# Patient Record
Sex: Female | Born: 1985 | Race: Black or African American | Hispanic: No | Marital: Single | Smoking: Never smoker
Health system: Southern US, Community
[De-identification: ages and names within clinical notes are randomized; demographics above are authoritative.]

## PROBLEM LIST (undated history)

## (undated) ENCOUNTER — Inpatient Hospital Stay (HOSPITAL_COMMUNITY): Payer: Self-pay

## (undated) DIAGNOSIS — E039 Hypothyroidism, unspecified: Secondary | ICD-10-CM

## (undated) DIAGNOSIS — I10 Essential (primary) hypertension: Secondary | ICD-10-CM

## (undated) DIAGNOSIS — E119 Type 2 diabetes mellitus without complications: Secondary | ICD-10-CM

## (undated) HISTORY — DX: Hypothyroidism, unspecified: E03.9

## (undated) HISTORY — PX: NO PAST SURGERIES: SHX2092

---

## 2015-02-18 ENCOUNTER — Inpatient Hospital Stay (HOSPITAL_COMMUNITY): Payer: Medicaid Other

## 2015-02-18 ENCOUNTER — Inpatient Hospital Stay (HOSPITAL_COMMUNITY)
Admission: AD | Admit: 2015-02-18 | Discharge: 2015-02-18 | Disposition: A | Discharge status: Home / Self Care | Payer: Medicaid Other | Source: Ambulatory Visit | Attending: Obstetrics & Gynecology | Admitting: Obstetrics & Gynecology

## 2015-02-18 ENCOUNTER — Encounter (HOSPITAL_COMMUNITY): Payer: Self-pay

## 2015-02-18 DIAGNOSIS — E119 Type 2 diabetes mellitus without complications: Secondary | ICD-10-CM | POA: Insufficient documentation

## 2015-02-18 DIAGNOSIS — O209 Hemorrhage in early pregnancy, unspecified: Secondary | ICD-10-CM

## 2015-02-18 DIAGNOSIS — O208 Other hemorrhage in early pregnancy: Secondary | ICD-10-CM | POA: Insufficient documentation

## 2015-02-18 DIAGNOSIS — O24111 Pre-existing diabetes mellitus, type 2, in pregnancy, first trimester: Secondary | ICD-10-CM | POA: Insufficient documentation

## 2015-02-18 DIAGNOSIS — O10911 Unspecified pre-existing hypertension complicating pregnancy, first trimester: Secondary | ICD-10-CM | POA: Insufficient documentation

## 2015-02-18 DIAGNOSIS — Z3A08 8 weeks gestation of pregnancy: Secondary | ICD-10-CM | POA: Insufficient documentation

## 2015-02-18 DIAGNOSIS — N939 Abnormal uterine and vaginal bleeding, unspecified: Secondary | ICD-10-CM | POA: Diagnosis present

## 2015-02-18 DIAGNOSIS — O4691 Antepartum hemorrhage, unspecified, first trimester: Secondary | ICD-10-CM

## 2015-02-18 HISTORY — DX: Type 2 diabetes mellitus without complications: E11.9

## 2015-02-18 HISTORY — DX: Essential (primary) hypertension: I10

## 2015-02-18 LAB — URINALYSIS, ROUTINE W REFLEX MICROSCOPIC
Glucose, UA: NEGATIVE mg/dL
Ketones, ur: 15 mg/dL — AB
LEUKOCYTES UA: NEGATIVE
NITRITE: NEGATIVE
PH: 5.5 (ref 5.0–8.0)
Protein, ur: 100 mg/dL — AB

## 2015-02-18 LAB — URINE MICROSCOPIC-ADD ON

## 2015-02-18 LAB — CBC
HEMATOCRIT: 35.4 % — AB (ref 36.0–46.0)
Hemoglobin: 12.1 g/dL (ref 12.0–15.0)
MCH: 25.6 pg — ABNORMAL LOW (ref 26.0–34.0)
MCHC: 34.2 g/dL (ref 30.0–36.0)
MCV: 75 fL — AB (ref 78.0–100.0)
Platelets: 443 10*3/uL — ABNORMAL HIGH (ref 150–400)
RBC: 4.72 MIL/uL (ref 3.87–5.11)
RDW: 13.9 % (ref 11.5–15.5)
WBC: 13.5 10*3/uL — AB (ref 4.0–10.5)

## 2015-02-18 LAB — WET PREP, GENITAL
Sperm: NONE SEEN
TRICH WET PREP: NONE SEEN
Yeast Wet Prep HPF POC: NONE SEEN

## 2015-02-18 LAB — HCG, QUANTITATIVE, PREGNANCY: hCG, Beta Chain, Quant, S: 10628 m[IU]/mL — ABNORMAL HIGH (ref <5)

## 2015-02-18 LAB — ABO/RH: ABO/RH(D): B POS

## 2015-02-18 LAB — POCT PREGNANCY, URINE: PREG TEST UR: POSITIVE — AB

## 2015-02-18 NOTE — MAU Note (Addendum)
Had positive UPT at ED at [facility], LMP 12/21/14, started having vaginal bleeding which started about 45 minutes ago light in nature has not changed a pad, no recent intercourse, no pain. Has history of hight blood pressure just ran out of her med.

## 2015-02-18 NOTE — Discharge Instructions (Signed)
No smoking, no drugs, no alcohol.  Take a prenatal vitamin one by mouth every day.  Eat small frequent snacks to avoid nausea.  Begin prenatal care as soon as possible. Expect the clinic to call you about an appointment downstairs at the [facility].

## 2015-02-18 NOTE — MAU Provider Note (Signed)
History     CSN: [id number]  Arrival date and time: 02/18/15 1415   First Provider Initiated Contact with Patient 02/18/15 1550      Chief Complaint  Patient presents with  . Vaginal Bleeding   HPI Jillian Hanson 30 y.o. [redacted]w[redacted]d  Client had acute onset of bright red bleeding while at work.  Has known she was pregnant for a couple of weeks.  Was surprised by the bleeding.  Has not had intercourse today.  Is not having cramping at this time.  Has not yet started prenatal care.  Is wondering if this bleeding is a miscarriage.  OB History    Gravida Para Term Preterm AB TAB SAB Ectopic Multiple Living   1               Past Medical History  Diagnosis Date  . Hypertension   . Diabetes mellitus without complication (HCC)     History reviewed. No pertinent past surgical history.  History reviewed. No pertinent family history.  Social History  Substance Use Topics  . Smoking status: Never Smoker   . Smokeless tobacco: Never Used  . Alcohol Use: No    Allergies:  Allergies  Allergen Reactions  . Peanut-Containing Drug Products Anaphylaxis    Prescriptions prior to admission  Medication Sig Dispense Refill Last Dose  . Cephalexin (KEFLEX PO) Take 1 capsule by mouth 4 (four) times daily - after meals and at bedtime. Unknown strength, pharmacy closed at time to verify   02/17/2015 at Unknown time  . levothyroxine (SYNTHROID, LEVOTHROID) 100 MCG tablet Take 100 mcg by mouth daily before breakfast.   Past Month at Unknown time  . LISINOPRIL PO Take 1 tablet by mouth daily. Reported on 02/18/2015   12/19/2014  . METFORMIN HCL PO Take 1 tablet by mouth daily. Unknown strength/form, pharmacy closed at time to verify   12/19/2014  . Prenatal Vit-Fe Fumarate-FA (PRENATAL MULTIVITAMIN) TABS tablet Take 1 tablet by mouth daily at 12 noon.   02/18/2015 at Unknown time  . triamcinolone cream (KENALOG) 0.1 % Apply 1 application topically daily.   02/18/2015 at Unknown time    Review of Systems   Constitutional: Negative for fever.  Gastrointestinal: Negative for nausea, vomiting and abdominal pain.  Genitourinary:       No vaginal discharge. Vaginal bleeding. No dysuria.    Physical Exam   Blood pressure 162/105, pulse 112, temperature 98.6 F (37 C), temperature source Oral, resp. rate 18, height 5' 2.99" (1.6 m), weight 249 lb 3.2 oz (113.036 kg), last menstrual period 12/21/2014, SpO2 100 %.  Physical Exam  Nursing note and vitals reviewed. Constitutional: She is oriented to person, place, and time. She appears well-developed and well-nourished.  obese  HENT:  Head: Normocephalic.  Eyes: EOM are normal.  Neck: Neck supple.  GI: Soft. There is no tenderness.  Genitourinary:  Speculum exam: Vagina - Small amount of red blood noted, no odor Cervix - No active bleeding Bimanual exam: Cervix closed Uterus non tender, exam limited by habitus Adnexa non tender, exam limited by habitus GC/Chlam, wet prep done Chaperone present for exam.  Musculoskeletal: Normal range of motion.  Neurological: She is alert and oriented to person, place, and time.  Skin: Skin is warm and dry.  Psychiatric: She has a normal mood and affect.    MAU Course  Procedures Results for orders placed or performed during the hospital encounter of 02/18/15 (from the past 24 hour(s))  Urinalysis, Routine w reflex microscopic (not  at [facility])     Status: Abnormal   Collection Time: 02/18/15  2:30 PM  Result Value Ref Range   Color, Urine AMBER (A) YELLOW   APPearance CLEAR CLEAR   Specific Gravity, Urine >1.030 (H) 1.005 - 1.030   pH 5.5 5.0 - 8.0   Glucose, UA NEGATIVE NEGATIVE mg/dL   Hgb urine dipstick LARGE (A) NEGATIVE   Bilirubin Urine SMALL (A) NEGATIVE   Ketones, ur 15 (A) NEGATIVE mg/dL   Protein, ur 914 (A) NEGATIVE mg/dL   Nitrite NEGATIVE NEGATIVE   Leukocytes, UA NEGATIVE NEGATIVE  Urine microscopic-add on     Status: Abnormal   Collection Time: 02/18/15  2:30 PM  Result Value  Ref Range   Squamous Epithelial / LPF 6-30 (A) NONE SEEN   WBC, UA 0-5 0 - 5 WBC/hpf   RBC / HPF 6-30 0 - 5 RBC/hpf   Bacteria, UA FEW (A) NONE SEEN   Urine-Other MUCOUS PRESENT   Pregnancy, urine POC     Status: Abnormal   Collection Time: 02/18/15  2:38 PM  Result Value Ref Range   Preg Test, Ur POSITIVE (A) NEGATIVE  CBC     Status: Abnormal   Collection Time: 02/18/15  2:50 PM  Result Value Ref Range   WBC 13.5 (H) 4.0 - 10.5 K/uL   RBC 4.72 3.87 - 5.11 MIL/uL   Hemoglobin 12.1 12.0 - 15.0 g/dL   HCT 78.2 (L) 95.6 - 21.3 %   MCV 75.0 (L) 78.0 - 100.0 fL   MCH 25.6 (L) 26.0 - 34.0 pg   MCHC 34.2 30.0 - 36.0 g/dL   RDW 08.6 57.8 - 46.9 %   Platelets 443 (H) 150 - 400 K/uL  hCG, quantitative, pregnancy     Status: Abnormal   Collection Time: 02/18/15  2:50 PM  Result Value Ref Range   hCG, Beta Chain, Quant, S 10628 (H) <5 mIU/mL  ABO/Rh     Status: None (Preliminary result)   Collection Time: 02/18/15  2:50 PM  Result Value Ref Range   ABO/RH(D) B POS   Wet prep, genital     Status: Abnormal   Collection Time: 02/18/15  4:56 PM  Result Value Ref Range   Yeast Wet Prep HPF POC NONE SEEN NONE SEEN   Trich, Wet Prep NONE SEEN NONE SEEN   Clue Cells Wet Prep HPF POC PRESENT (A) NONE SEEN   WBC, Wet Prep HPF POC FEW (A) NONE SEEN   Sperm NONE SEEN    CLINICAL DATA: First trimester pregnancy, vaginal bleeding  EXAM: OBSTETRIC <14 WK [place] AND TRANSVAGINAL OB US  TECHNIQUE: Both transabdominal and transvaginal ultrasound examinations were performed for complete evaluation of the gestation as well as the maternal uterus, adnexal regions, and pelvic cul-de-sac. Transvaginal technique was performed to assess early pregnancy.  COMPARISON: None.  FINDINGS: Intrauterine gestational sac: Visualized/normal in shape.  Yolk sac: Visualized  Embryo: Visualized  Cardiac Activity: Visualized  Heart Rate: 144 bpm  CRL: 6 mm 6 w 3 d [place] EDC:  10/11/2015  Subchorionic hemorrhage: None visualized.  Maternal uterus/adnexae: Right ovary not visualized. Left ovary normal. No free fluid.  IMPRESSION: Single live intrauterine gestation with normal appearance   MDM Repeat blood pressure after pelvic exam -136/72.  Has recently stopped taking her blood pressure medications - ran out.  Assessment and Plan  Living IUP with vaginal bleeding Hx of Diabetes Type 2 - currently on medication Hx of HTN - currently not on medication  Plan No smoking, no drugs, no alcohol.  Take a prenatal vitamin one by mouth every day.  Eat small frequent snacks to avoid nausea.  Begin prenatal care as soon as possible.  Message sent to [facility] clinic to see if they can arrange an appointment to begin prenatal care.  If not, client can be [facility] at the [facility] and then referred to the [facility].  Client reports her blood sugars have been about 165.  Recommend she begin prenatal care ASAP. Continue metformin. Do not restart BP or Thyroid meds until you are reevaluated.  Did not start BP meds today as repeat BP was normal.   BURLESON,TERRI 02/18/2015, 4:57 PM

## 2015-02-19 LAB — RPR: RPR Ser Ql: NONREACTIVE

## 2015-02-19 LAB — HIV ANTIBODY (ROUTINE TESTING W REFLEX): HIV Screen 4th Generation wRfx: NONREACTIVE

## 2015-02-20 LAB — GC/CHLAMYDIA PROBE AMP (~~LOC~~) NOT AT ARMC
CHLAMYDIA, DNA PROBE: NEGATIVE
Neisseria Gonorrhea: NEGATIVE

## 2015-02-26 ENCOUNTER — Other Ambulatory Visit: Payer: Self-pay

## 2015-02-26 ENCOUNTER — Inpatient Hospital Stay (HOSPITAL_COMMUNITY)
Admission: AD | Admit: 2015-02-26 | Discharge: 2015-02-26 | Disposition: A | Discharge status: Home / Self Care | Payer: Medicaid Other | Source: Ambulatory Visit | Attending: Obstetrics & Gynecology | Admitting: Obstetrics & Gynecology

## 2015-02-26 ENCOUNTER — Encounter (HOSPITAL_COMMUNITY): Payer: Self-pay | Admitting: *Deleted

## 2015-02-26 DIAGNOSIS — R000 Tachycardia, unspecified: Secondary | ICD-10-CM | POA: Diagnosis present

## 2015-02-26 DIAGNOSIS — O26891 Other specified pregnancy related conditions, first trimester: Secondary | ICD-10-CM | POA: Insufficient documentation

## 2015-02-26 DIAGNOSIS — O99891 Other specified diseases and conditions complicating pregnancy: Secondary | ICD-10-CM

## 2015-02-26 DIAGNOSIS — O10911 Unspecified pre-existing hypertension complicating pregnancy, first trimester: Secondary | ICD-10-CM | POA: Diagnosis not present

## 2015-02-26 DIAGNOSIS — Z3A01 Less than 8 weeks gestation of pregnancy: Secondary | ICD-10-CM | POA: Insufficient documentation

## 2015-02-26 DIAGNOSIS — O9989 Other specified diseases and conditions complicating pregnancy, childbirth and the puerperium: Secondary | ICD-10-CM

## 2015-02-26 DIAGNOSIS — M549 Dorsalgia, unspecified: Secondary | ICD-10-CM | POA: Diagnosis not present

## 2015-02-26 DIAGNOSIS — O368390 Maternal care for abnormalities of the fetal heart rate or rhythm, unspecified trimester, not applicable or unspecified: Secondary | ICD-10-CM

## 2015-02-26 LAB — COMPREHENSIVE METABOLIC PANEL
ALK PHOS: 62 U/L (ref 38–126)
ALT: 34 U/L (ref 14–54)
AST: 30 U/L (ref 15–41)
Albumin: 3.5 g/dL (ref 3.5–5.0)
Anion gap: 11 (ref 5–15)
BUN: 9 mg/dL (ref 6–20)
CALCIUM: 9.6 mg/dL (ref 8.9–10.3)
CO2: 25 mmol/L (ref 22–32)
CREATININE: 0.55 mg/dL (ref 0.44–1.00)
Chloride: 99 mmol/L — ABNORMAL LOW (ref 101–111)
Glucose, Bld: 205 mg/dL — ABNORMAL HIGH (ref 65–99)
Potassium: 3.8 mmol/L (ref 3.5–5.1)
Sodium: 135 mmol/L (ref 135–145)
Total Bilirubin: 0.5 mg/dL (ref 0.3–1.2)
Total Protein: 8.5 g/dL — ABNORMAL HIGH (ref 6.5–8.1)

## 2015-02-26 LAB — URINALYSIS, ROUTINE W REFLEX MICROSCOPIC
Bilirubin Urine: NEGATIVE
GLUCOSE, UA: NEGATIVE mg/dL
Hgb urine dipstick: NEGATIVE
Ketones, ur: 15 mg/dL — AB
LEUKOCYTES UA: NEGATIVE
Nitrite: NEGATIVE
PH: 6 (ref 5.0–8.0)
Protein, ur: NEGATIVE mg/dL
Specific Gravity, Urine: >1.03 — ABNORMAL HIGH (ref 1.005–1.030)

## 2015-02-26 LAB — CBC
HEMATOCRIT: 37.2 % (ref 36.0–46.0)
HEMOGLOBIN: 12.7 g/dL (ref 12.0–15.0)
MCH: 25.5 pg — AB (ref 26.0–34.0)
MCHC: 34.1 g/dL (ref 30.0–36.0)
MCV: 74.5 fL — AB (ref 78.0–100.0)
PLATELETS: 472 10*3/uL — AB (ref 150–400)
RBC: 4.99 MIL/uL (ref 3.87–5.11)
RDW: 14 % (ref 11.5–15.5)
WBC: 8 10*3/uL (ref 4.0–10.5)

## 2015-02-26 MED ORDER — LABETALOL HCL 100 MG PO TABS: 100.0000 mg | ORAL_TABLET | Freq: Two times a day (BID) | ORAL | Status: DC

## 2015-02-26 MED ORDER — LABETALOL HCL 100 MG PO TABS
100.0000 mg | ORAL_TABLET | Freq: Once | ORAL | Status: AC
  Administered 2015-02-26: 100 mg via ORAL
  Filled 2015-02-26: qty 1

## 2015-02-26 NOTE — MAU Provider Note (Signed)
Chief Complaint: Pain between shoulder blades    First Provider Initiated Contact with Patient 02/26/15 1616     SUBJECTIVE HPI: Jillian Hanson is a 30 y.o. G1P0 at [redacted]w[redacted]d who presents to Maternity Admissions reporting rapid heart rate 2 days in pain between shoulder blades since last night both severe enough to wake her repeatedly from sleep. Partial improvement of pain with massage and warm bath. Has history of chronic hypertension and is worried that these may be symptoms of cardiac arrest.   Has been told that she has elevated heart rate in the past. Had thyroid checked and was started on levothyroxine. Has not taken that for one month because she ran out. Has not had any cardiac workup. Has never been put on any other medicine for rapid heart rate.  Location: Between shoulder blades Quality: Tight, sometimes sharp Severity: 5/10 on pain scale Duration: 48 hours Context: None Course: Unchanged Timing: Constant Modifying factors: Partial improvement with massage and warm bath.  Associated signs and symptoms: Positive for mild shortness of breath when lying down. Negative for chest pain, dizziness, palpitations, syncope or near syncope.   Past Medical History  Diagnosis Date  . Hypertension   . Diabetes mellitus without complication (HCC)    OB History  Gravida Para Term Preterm AB SAB TAB Ectopic Multiple Living  1             # Outcome Date GA Lbr Len/2nd Weight Sex Delivery Anes PTL Lv  1 Current              Past Surgical History  Procedure Laterality Date  . No past surgeries     Social History   Social History  . Marital Status: Single    Spouse Name: N/A  . Number of Children: N/A  . Years of Education: N/A   Occupational History  . Not on file.   Social History Main Topics  . Smoking status: Never Smoker   . Smokeless tobacco: Never Used  . Alcohol Use: No  . Drug Use: No  . Sexual Activity: Yes   Other Topics Concern  . Not on file   Social History  Narrative   No current facility-administered medications on file prior to encounter.   Current Outpatient Prescriptions on File Prior to Encounter  Medication Sig Dispense Refill  . Prenatal Vit-Fe Fumarate-FA (PRENATAL MULTIVITAMIN) TABS tablet Take 1 tablet by mouth daily at 12 noon.     Allergies  Allergen Reactions  . Peanut-Containing Drug Products Anaphylaxis    I have reviewed the past Medical Hx, Surgical Hx, Social Hx, Allergies and Medications.   Review of Systems  Constitutional: Negative for fever, chills and appetite change.  Respiratory: Positive for shortness of breath. Negative for cough, chest tightness and wheezing.   Cardiovascular: Negative for chest pain, palpitations and leg swelling.  Gastrointestinal: Negative for abdominal pain.    OBJECTIVE Patient Vitals for the past 24 hrs:  BP Temp Temp src Pulse Resp SpO2  02/26/15 1716 135/83 mmHg - - 96 - -  02/26/15 1713 - - - 103 - 100 %  02/26/15 1708 - - - 106 - 98 %  02/26/15 1703 - - - 102 - 100 %  02/26/15 1701 140/84 mmHg - - 98 - -  02/26/15 1658 - - - 104 - 100 %  02/26/15 1653 - - - 111 - 99 %  02/26/15 1649 141/91 mmHg - - (!) 127 - -  02/26/15 1648 - - - 116 -  100 %  02/26/15 1647 - - - 110 - 98 %  02/26/15 1645 - - - 99 - 98 %  02/26/15 1640 - - - 107 - 100 %  02/26/15 1636 - - - 99 - 99 %  02/26/15 1634 132/88 mmHg - - 104 - 100 %  02/26/15 1627 - - - 102 - 100 %  02/26/15 1622 - - - 110 - 100 %  02/26/15 1621 148/95 mmHg - - 103 - -  02/26/15 1620 - - - - - 100 %  02/26/15 1616 143/86 mmHg - - 102 - -  02/26/15 1615 143/86 mmHg - - 102 - 100 %  02/26/15 1606 144/91 mmHg 98.4 F (36.9 C) Oral 111 18 -   Constitutional: Well-developed, well-nourished female in no acute distress. No pallor or diaphoresis.  Cardiovascular: Mild tachycardia, regular rhythm. No M/R/G Respiratory: normal rate and effort.  MS: Extremities nontender, no edema, neg Homan's Neurologic: Alert and oriented x 4.   GU: Deferred  LAB RESULTS Results for orders placed or performed during the hospital encounter of 02/26/15 (from the past 24 hour(s))  Urinalysis, Routine w reflex microscopic (not at [facility])     Status: Abnormal   Collection Time: 02/26/15  4:00 PM  Result Value Ref Range   Color, Urine YELLOW YELLOW   APPearance CLEAR CLEAR   Specific Gravity, Urine >1.030 (H) 1.005 - 1.030   pH 6.0 5.0 - 8.0   Glucose, UA NEGATIVE NEGATIVE mg/dL   Hgb urine dipstick NEGATIVE NEGATIVE   Bilirubin Urine NEGATIVE NEGATIVE   Ketones, ur 15 (A) NEGATIVE mg/dL   Protein, ur NEGATIVE NEGATIVE mg/dL   Nitrite NEGATIVE NEGATIVE   Leukocytes, UA NEGATIVE NEGATIVE  CBC     Status: Abnormal   Collection Time: 02/26/15  4:25 PM  Result Value Ref Range   WBC 8.0 4.0 - 10.5 K/uL   RBC 4.99 3.87 - 5.11 MIL/uL   Hemoglobin 12.7 12.0 - 15.0 g/dL   HCT 69.6 29.5 - 28.4 %   MCV 74.5 (L) 78.0 - 100.0 fL   MCH 25.5 (L) 26.0 - 34.0 pg   MCHC 34.1 30.0 - 36.0 g/dL   RDW 13.2 44.0 - 10.2 %   Platelets 472 (H) 150 - 400 K/uL  Comprehensive metabolic panel     Status: Abnormal   Collection Time: 02/26/15  4:25 PM  Result Value Ref Range   Sodium 135 135 - 145 mmol/L   Potassium 3.8 3.5 - 5.1 mmol/L   Chloride 99 (L) 101 - 111 mmol/L   CO2 25 22 - 32 mmol/L   Glucose, Bld 205 (H) 65 - 99 mg/dL   BUN 9 6 - 20 mg/dL   Creatinine, Ser 7.25 0.44 - 1.00 mg/dL   Calcium 9.6 8.9 - 36.6 mg/dL   Total Protein 8.5 (H) 6.5 - 8.1 g/dL   Albumin 3.5 3.5 - 5.0 g/dL   AST 30 15 - 41 U/L   ALT 34 14 - 54 U/L   Alkaline Phosphatase 62 38 - 126 U/L   Total Bilirubin 0.5 0.3 - 1.2 mg/dL   GFR calc non Af Amer >60 >60 mL/min   GFR calc Af Amer >60 >60 mL/min   Anion gap 11 5 - 15    IMAGING NA  MAU COURSE EKG, CBC, CMET, UA, Troponin, Continuous SpO2 and BP's.  Labetalol given for HTN.   Discussed Hx, exam, labs, EKG, BP's, HR's w/ Dr. Penne Lash. Agrees w/ POC. Will start Labetalol  100 mg PO BID for BP and may also help  w/ Tachycardia.   MDM - 30 year-old female at 7.[redacted] weeks gestation adn mild, symptomatic tachycardia, but no evidence of cardiac or obstetric emergency.  - CHTN in Pregnancy. Tx w/ Labetalol.  - Mid back pain likely MS. Warm compresses and massage.   ASSESSMENT 1. Maternal care for fetal tachycardia during pregnancy   2. Back pain affecting pregnancy in first trimester   3. Chronic hypertension in pregnancy, first trimester    PLAN Discharge home in stable condition. First trimester precautions. Tachycardia precautions.  Increase fluids.   Follow-up Information    Follow up with [facility] On 02/27/2015.   Specialty:  Obstetrics and Gynecology   Why:  Routine prenatal visit   Contact information:   [address] [place] [address] [phone]      Follow up with [facility] [place] MATERNITY ADMISSIONS.   Why:  As needed in ob/gyn emergencies   Contact information:   [address] [id number] mc [place] [address] [phone]      Follow up with [facility] EMERGENCY DEPARTMENT.   Specialty:  Emergency Medicine   Why:  As needed in cardiac emergencies   Contact information:   [address] [id number] mc [place] [address] [phone]       Medication List    STOP taking these medications        aspirin 325 MG tablet      TAKE these medications        cephALEXin 500 MG capsule  Commonly known as:  KEFLEX  Take 500 mg by mouth 4 (four) times daily.     labetalol 100 MG tablet  Commonly known as:  NORMODYNE  Take 1 tablet (100 mg total) by mouth 2 (two) times daily.     metFORMIN 500 MG tablet  Commonly known as:  GLUCOPHAGE  Take 500 mg by mouth daily with breakfast.     prenatal multivitamin Tabs tablet  Take 1 tablet by mouth daily at 12 noon.         [place], [place] 02/26/2015  5:23 PM

## 2015-02-26 NOTE — MAU Note (Signed)
Pt states she has been unable sleep because when she tries to sleep her heart starts beating fast.  Last night the pt noticed she started having pain between her shoulder blades.  Pt states taking a bath made it a little better but when she was done it came right back.

## 2015-02-26 NOTE — Discharge Instructions (Signed)
Nonspecific Tachycardia Tachycardia is a faster than normal heartbeat (more than 100 beats per minute). In adults, the heart normally beats between 60 and 100 times a minute. A fast heartbeat may be a normal response to exercise or stress. It does not necessarily mean that something is wrong. However, sometimes when your heart beats too fast it may not be able to pump enough blood to the rest of your body. This can result in chest pain, shortness of breath, dizziness, and even fainting. Nonspecific tachycardia means that the specific cause or pattern of your tachycardia is unknown. CAUSES  Tachycardia may be harmless or it may be due to a more serious underlying cause. Possible causes of tachycardia include:  Exercise or exertion.  Fever.  Pain or injury.  Infection.  Loss of body fluids (dehydration).  Overactive thyroid.  Lack of red blood cells (anemia).  Anxiety and stress.  Alcohol.  Caffeine.  Tobacco products.  Diet pills.  Illegal drugs.  Heart disease. SYMPTOMS  Rapid or irregular heartbeat (palpitations).  Suddenly feeling your heart beating (cardiac awareness).  Dizziness.  Tiredness (fatigue).  Shortness of breath.  Chest pain.  Nausea.  Fainting. DIAGNOSIS  Your caregiver will perform a physical exam and take your medical history. In some cases, a heart specialist (cardiologist) may be consulted. Your caregiver may also order:  Blood tests.  Electrocardiography. This test records the electrical activity of your heart.  A heart monitoring test. TREATMENT  Treatment will depend on the likely cause of your tachycardia. The goal is to treat the underlying cause of your tachycardia. Treatment methods may include:  Replacement of fluids or blood through an intravenous (IV) tube for moderate to severe dehydration or anemia.  New medicines or changes in your current medicines.  Diet and lifestyle changes.  Treatment for certain  infections.  Stress relief or relaxation methods. HOME CARE INSTRUCTIONS   Rest.  Drink enough fluids to keep your urine clear or pale yellow.  Do not smoke.  Avoid:  Caffeine.  Tobacco.  Alcohol.  Chocolate.  Stimulants such as over-the-counter diet pills or pills that help you stay awake.  Situations that cause anxiety or stress.  Illegal drugs such as marijuana, phencyclidine (PCP), and cocaine.  Only take medicine as directed by your caregiver.  Keep all follow-up appointments as directed by your caregiver. SEEK IMMEDIATE MEDICAL CARE IF:   You have pain in your chest, upper arms, jaw, or neck.  You become weak, dizzy, or feel faint.  You have palpitations that will not go away.  You vomit, have diarrhea, or pass blood in your stool.  Your skin is cool, pale, and wet.  You have a fever that will not go away with rest, fluids, and medicine. MAKE SURE YOU:   Understand these instructions.  Will watch your condition.  Will get help right away if you are not doing well or get worse.   This information is not intended to replace advice given to you by your health care provider. Make sure you discuss any questions you have with your health care provider.   Document Released: 02/22/2004 Document Revised: 04/08/2011 Document Reviewed: 07/29/2014 Elsevier Interactive Patient Education 2016 [employer].   Hypertension During Pregnancy Hypertension, or high blood pressure, is when there is extra pressure inside your blood vessels that carry blood from the heart to the rest of your body (arteries). It can happen at any time in life, including pregnancy. Hypertension during pregnancy can cause problems for you and your  baby. Your baby might not weigh as much as he or she should at birth or might be born early (premature). Very bad cases of hypertension during pregnancy can be life-threatening.  Different types of hypertension can occur during pregnancy. These  include:  Chronic hypertension. This happens when a woman has hypertension before pregnancy and it continues during pregnancy.  Gestational hypertension. This is when hypertension develops during pregnancy.  Preeclampsia or toxemia of pregnancy. This is a very serious type of hypertension that develops only during pregnancy. It affects the whole body and can be very dangerous for both mother and baby.  Gestational hypertension and preeclampsia usually go away after your baby is born. Your blood pressure will likely stabilize within 6 weeks. Women who have hypertension during pregnancy have a greater chance of developing hypertension later in life or with future pregnancies. RISK FACTORS There are certain factors that make it more likely for you to develop hypertension during pregnancy. These include:  Having hypertension before pregnancy.  Having hypertension during a previous pregnancy.  Being overweight.  Being older than 40 years.  Being pregnant with more than one baby.  Having diabetes or kidney problems. SIGNS AND SYMPTOMS Chronic and gestational hypertension rarely cause symptoms. Preeclampsia has symptoms, which may include:  Increased protein in your urine. Your health care provider will check for this at every prenatal visit.  Swelling of your hands and face.  Rapid weight gain.  Headaches.  Visual changes.  Being bothered by light.  Abdominal pain, especially in the upper right area.  Chest pain.  Shortness of breath.  Increased reflexes.  Seizures. These occur with a more severe form of preeclampsia, called eclampsia. DIAGNOSIS  You may be diagnosed with hypertension during a regular prenatal exam. At each prenatal visit, you may have:  Your blood pressure checked.  A urine test to check for protein in your urine. The type of hypertension you are diagnosed with depends on when you developed it. It also depends on your specific blood pressure  reading.  Developing hypertension before 20 weeks of pregnancy is consistent with chronic hypertension.  Developing hypertension after 20 weeks of pregnancy is consistent with gestational hypertension.  Hypertension with increased urinary protein is diagnosed as preeclampsia.  Blood pressure measurements that stay above 160 systolic or 110 diastolic are a sign of severe preeclampsia. TREATMENT Treatment for hypertension during pregnancy varies. Treatment depends on the type of hypertension and how serious it is.  If you take medicine for chronic hypertension, you may need to switch medicines.  Medicines called ACE inhibitors should not be taken during pregnancy.  Low-dose aspirin may be suggested for women who have risk factors for preeclampsia.  If you have gestational hypertension, you may need to take a blood pressure medicine that is safe during pregnancy. Your health care provider will recommend the correct medicine.  If you have severe preeclampsia, you may need to be in the hospital. Health care providers will watch you and your baby very closely. You also may need to take medicine called magnesium sulfate to prevent seizures and lower blood pressure.  Sometimes, an early delivery is needed. This may be the case if the condition worsens. It would be done to protect you and your baby. The only cure for preeclampsia is delivery.  Your health care provider may recommend that you take one low-dose aspirin (81 mg) each day to help prevent high blood pressure during your pregnancy if you are at risk for preeclampsia. You may be  at risk for preeclampsia if:  You had preeclampsia or eclampsia during a previous pregnancy.  Your baby did not grow as expected during a previous pregnancy.  You experienced preterm birth with a previous pregnancy.  You experienced a separation of the placenta from the uterus (placental abruption) during a previous pregnancy.  You experienced the loss of  your baby during a previous pregnancy.  You are pregnant with more than one baby.  You have other medical conditions, such as diabetes or an autoimmune disease. HOME CARE INSTRUCTIONS  Schedule and keep all of your regular prenatal care appointments. This is important.  Take medicines only as directed by your health care provider. Tell your health care provider about all medicines you take.  Eat as little salt as possible.  Get regular exercise.  Do not drink alcohol.  Do not use tobacco products.  Do not drink products with caffeine.  Lie on your left side when resting. SEEK IMMEDIATE MEDICAL CARE IF:  You have severe abdominal pain.  You have sudden swelling in your hands, ankles, or face.  You gain 4 pounds (1.8 kg) or more in 1 week.  You vomit repeatedly.  You have vaginal bleeding.  You do not feel your baby moving as much.  You have a headache.  You have blurred or double vision.  You have muscle twitching or spasms.  You have shortness of breath.  You have blue fingernails or lips.  You have blood in your urine. MAKE SURE YOU:  Understand these instructions.  Will watch your condition.  Will get help right away if you are not doing well or get worse.   This information is not intended to replace advice given to you by your health care provider. Make sure you discuss any questions you have with your health care provider.   Document Released: 10/02/2010 Document Revised: 02/04/2014 Document Reviewed: 08/13/2012 Elsevier Interactive Patient Education [employer]

## 2015-02-27 ENCOUNTER — Ambulatory Visit: Payer: Self-pay | Admitting: *Deleted

## 2015-02-27 ENCOUNTER — Encounter: Payer: Medicaid Other | Attending: Obstetrics & Gynecology | Admitting: *Deleted

## 2015-02-27 DIAGNOSIS — Z3A01 Less than 8 weeks gestation of pregnancy: Secondary | ICD-10-CM | POA: Diagnosis not present

## 2015-02-27 DIAGNOSIS — O24111 Pre-existing diabetes mellitus, type 2, in pregnancy, first trimester: Secondary | ICD-10-CM

## 2015-02-27 DIAGNOSIS — O10911 Unspecified pre-existing hypertension complicating pregnancy, first trimester: Secondary | ICD-10-CM

## 2015-02-27 DIAGNOSIS — M549 Dorsalgia, unspecified: Secondary | ICD-10-CM

## 2015-02-27 DIAGNOSIS — O9989 Other specified diseases and conditions complicating pregnancy, childbirth and the puerperium: Secondary | ICD-10-CM

## 2015-02-27 DIAGNOSIS — O24419 Gestational diabetes mellitus in pregnancy, unspecified control: Secondary | ICD-10-CM | POA: Insufficient documentation

## 2015-02-27 DIAGNOSIS — O368390 Maternal care for abnormalities of the fetal heart rate or rhythm, unspecified trimester, not applicable or unspecified: Secondary | ICD-10-CM

## 2015-02-27 DIAGNOSIS — O99891 Dorsalgia, unspecified: Secondary | ICD-10-CM

## 2015-02-27 MED ORDER — METFORMIN HCL 500 MG PO TABS: 500.0000 mg | ORAL_TABLET | Freq: Two times a day (BID) | ORAL | Status: AC

## 2015-02-27 MED ORDER — "INSULIN SYRINGE 31G X 5/16"" 1 ML MISC": 1.0000 | Freq: Two times a day (BID) | Status: AC

## 2015-02-27 MED ORDER — INSULIN NPH (HUMAN) (ISOPHANE) 100 UNIT/ML ~~LOC~~ SUSP: SUBCUTANEOUS | Status: DC

## 2015-02-27 NOTE — Progress Notes (Signed)
  Patient was seen on [birth year] for N7DK complicated by pregnancy self-management . Patient was recently seen in MAU and started on Metformin 571m daily. The following learning objectives were met by the patient :   States why dietary management is important in controlling blood glucose  States when to check blood glucose levels  Demonstrates proper blood glucose monitoring techniques  States the effect of stress and exercise on blood glucose levels  States the importance of limiting caffeine and abstaining from alcohol and smoking  Plan:  Consider  increasing your activity level by walking daily as tolerated Begin checking BG before breakfast and 2 hours after first bit of breakfast, lunch and dinner after  as directed by MD  Take medication  as directed by MD Increase Metformin to 5075mBID Start NPH 8 units with breakfast and dinner  Blood glucose monitor given: [employer] Lot # [id number]: 09/28/15  Patient instructed to monitor glucose levels: FBS: 60 - <90                       (15822ml this date) 2 hour: <120                         (172m41m this date)  Patient received the following handouts:  Nutrition Diabetes and Pregnancy  Patient has appointment in 03/06/15 with MD will reevaluate glucose in increasing insulin.

## 2015-03-06 ENCOUNTER — Ambulatory Visit: Payer: Self-pay | Admitting: *Deleted

## 2015-03-06 ENCOUNTER — Encounter: Payer: Medicaid Other | Attending: Obstetrics & Gynecology | Admitting: *Deleted

## 2015-03-06 VITALS — Ht 61.0 in | Wt 248.0 lb

## 2015-03-06 DIAGNOSIS — Z3A01 Less than 8 weeks gestation of pregnancy: Secondary | ICD-10-CM | POA: Diagnosis not present

## 2015-03-06 DIAGNOSIS — O24419 Gestational diabetes mellitus in pregnancy, unspecified control: Secondary | ICD-10-CM | POA: Insufficient documentation

## 2015-03-06 NOTE — Progress Notes (Signed)
Nutrition note: DM diet education Pt has h/o obesity. Pt has had DM since 2007 & was taking only Metformin but is now also taking insulin. Pt has lost 1# @ [redacted]w[redacted]d. Pt has been checking her BS & since she started taking insulin, her BS have been- fasting: 87-116 & 2hr pp- 91-121. Pt reports eating 3 meals & 3 snacks/d. Pt is taking a PNV. Pt reports no N&V or heartburn. Pt is allergic to peanuts. Pt reports walking at least 30 mins daily. Pt received verbal & written education on DM diet during pregnancy. Pt completed "Worksheet for Diabetes with Pregnancy." Discussed wt gain goals of 11-20# or 0.5#/wk. Pt agrees to follow DM diet with 3 meals & 1-3 snacks/d with proper CHO/ protein combination. Pt does not have WIC but plans to apply. Pt plans to BF. F/u in 4-6 wks Blondell Reveal, MS, RD, LDN, [facility]

## 2015-03-06 NOTE — Progress Notes (Signed)
Patient presents for review of glucose readings. FBS 87-174, 2hpp breakfast 91-205, Lunch 94-185, dinner 109-187.Patient is presently taking NPH 8units before lunch and 8 units after lunch. This is a modification patient made on her own. I have redirected her to take NPH insulin and Metformin with Breakfast and Dinner and we will reevaluate glucose readings in one week.

## 2015-03-07 ENCOUNTER — Telehealth: Payer: Self-pay | Admitting: General Practice

## 2015-03-07 NOTE — Telephone Encounter (Signed)
Patient called and left message on nurse line stating she wants to know if her visit will include an ultrasound. Called patient and discussed that her next ultrasound would be around 19weeks. Patient asked if she could get pictures from the ultrasound. Told patient we do not have a way to do that but can pull it up on our computer for her to see at her visit. Patient verbalized understanding & asked what the purpose of the visit was for. Told patient to meet with the doctor and review history and pregnancy/office expectations. Patient verbalized understanding and asked if she would hear the baby's heart beat. Told patient not at that visit because she will be too early for [place] to check it in office. Told patient she has to be closer to 12 weeks for that. Patient verbalized understanding & had no questions

## 2015-03-13 ENCOUNTER — Ambulatory Visit (INDEPENDENT_AMBULATORY_CARE_PROVIDER_SITE_OTHER): Payer: Self-pay | Admitting: Obstetrics & Gynecology

## 2015-03-13 ENCOUNTER — Encounter: Payer: Self-pay | Admitting: Obstetrics & Gynecology

## 2015-03-13 VITALS — BP 123/85 | HR 102 | Temp 97.7°F | Wt 250.0 lb

## 2015-03-13 DIAGNOSIS — E039 Hypothyroidism, unspecified: Secondary | ICD-10-CM

## 2015-03-13 DIAGNOSIS — Z7984 Long term (current) use of oral hypoglycemic drugs: Secondary | ICD-10-CM

## 2015-03-13 DIAGNOSIS — Z23 Encounter for immunization: Secondary | ICD-10-CM

## 2015-03-13 DIAGNOSIS — O3680X1 Pregnancy with inconclusive fetal viability, fetus 1: Secondary | ICD-10-CM

## 2015-03-13 DIAGNOSIS — O99281 Endocrine, nutritional and metabolic diseases complicating pregnancy, first trimester: Secondary | ICD-10-CM

## 2015-03-13 DIAGNOSIS — E118 Type 2 diabetes mellitus with unspecified complications: Secondary | ICD-10-CM

## 2015-03-13 DIAGNOSIS — O24111 Pre-existing diabetes mellitus, type 2, in pregnancy, first trimester: Secondary | ICD-10-CM

## 2015-03-13 DIAGNOSIS — O169 Unspecified maternal hypertension, unspecified trimester: Secondary | ICD-10-CM | POA: Insufficient documentation

## 2015-03-13 DIAGNOSIS — O0991 Supervision of high risk pregnancy, unspecified, first trimester: Secondary | ICD-10-CM

## 2015-03-13 DIAGNOSIS — O24119 Pre-existing diabetes mellitus, type 2, in pregnancy, unspecified trimester: Secondary | ICD-10-CM | POA: Insufficient documentation

## 2015-03-13 DIAGNOSIS — O99280 Endocrine, nutritional and metabolic diseases complicating pregnancy, unspecified trimester: Secondary | ICD-10-CM

## 2015-03-13 DIAGNOSIS — O161 Unspecified maternal hypertension, first trimester: Secondary | ICD-10-CM

## 2015-03-13 DIAGNOSIS — O0990 Supervision of high risk pregnancy, unspecified, unspecified trimester: Secondary | ICD-10-CM | POA: Insufficient documentation

## 2015-03-13 DIAGNOSIS — Z113 Encounter for screening for infections with a predominantly sexual mode of transmission: Secondary | ICD-10-CM

## 2015-03-13 DIAGNOSIS — Z124 Encounter for screening for malignant neoplasm of cervix: Secondary | ICD-10-CM

## 2015-03-13 DIAGNOSIS — O10911 Unspecified pre-existing hypertension complicating pregnancy, first trimester: Secondary | ICD-10-CM

## 2015-03-13 LAB — POCT URINALYSIS DIP (DEVICE)
BILIRUBIN URINE: NEGATIVE
GLUCOSE, UA: NEGATIVE mg/dL
KETONES UR: NEGATIVE mg/dL
Leukocytes, UA: NEGATIVE
NITRITE: NEGATIVE
PH: 5 (ref 5.0–8.0)
PROTEIN: NEGATIVE mg/dL
Specific Gravity, Urine: <=1.005 (ref 1.005–1.030)
Urobilinogen, UA: 0.2 mg/dL (ref 0.0–1.0)

## 2015-03-13 LAB — TSH: TSH: 1.79 m[IU]/L

## 2015-03-13 MED ORDER — ASPIRIN EC 81 MG PO TBEC: 81.0000 mg | DELAYED_RELEASE_TABLET | Freq: Every day | ORAL | Status: AC

## 2015-03-13 MED ORDER — LEVOTHYROXINE SODIUM 100 MCG PO TABS: 100.0000 ug | ORAL_TABLET | Freq: Every day | ORAL | Status: AC

## 2015-03-13 MED ORDER — INSULIN NPH (HUMAN) (ISOPHANE) 100 UNIT/ML ~~LOC~~ SUSP: SUBCUTANEOUS | Status: AC

## 2015-03-13 NOTE — Progress Notes (Signed)
Patient not currently on medication for thyroid, ran out a month ago Was instructed to be on pelvic rest when in ER for bleeding Patient questions dating; is sure of period & kept ovulation calendar.  Attempted to find FHR tones for approx. 3 minutes

## 2015-03-13 NOTE — Progress Notes (Signed)
First trimester screen 04/03/2015@10 :30AM Anatomy U/S 05/22/2015 :30AM

## 2015-03-13 NOTE — Progress Notes (Signed)
Bedside US for viability = Single IUP,  FHR - 180 bpm per PW doppler, FM present.

## 2015-03-13 NOTE — Progress Notes (Signed)
Subjective:    Jillian Hanson is a G1P0000 [redacted]w[redacted]d being seen today for her first obstetrical visit.  Her obstetrical history is significant for obesity and hypothyroidsm, hypertension, type 2 DM. Patient does intend to breast feed. Pregnancy history fully reviewed.  Patient reports no complaints.  Filed Vitals:   03/13/15 0804 03/13/15 0822  BP: 148/83 123/85  Pulse: 102   Temp: 97.7 F (36.5 C)   Weight: 250 lb (113.399 kg)     HISTORY: OB History  Gravida Para Term Preterm AB SAB TAB Ectopic Multiple Living     # Outcome Date GA Lbr Len/2nd Weight Sex Delivery Anes PTL Lv  1 Current              Past Medical History  Diagnosis Date  . Hypertension   . Diabetes mellitus without complication (HCC)   . Hypothyroidism    Past Surgical History  Procedure Laterality Date  . No past surgeries     Family History  Problem Relation Age of Onset  . Diabetes Mother   . Heart disease Father   . Asthma Brother      Exam    Uterus:   mboile 9 week size  Pelvic Exam:    Perineum: No Hemorrhoids   Vulva: normal   Vagina:  normal mucosa   pH: n/a   Cervix: no bleeding following Pap, no cervical motion tenderness and no lesions   Adnexa: normal adnexa and no mass, fullness, tenderness   Bony Pelvis: average  System: Breast:  normal appearance, no masses or tenderness   Skin: normal coloration and turgor, no rashes    Neurologic: oriented, normal mood   Extremities: normal strength, tone, and muscle mass, no deformities   HEENT extra ocular movement intact, sclera clear, anicteric, oropharynx clear, no lesions, neck supple with midline trachea, thyroid without masses and trachea midline   Mouth/Teeth mucous membranes moist, pharynx normal without lesions and dental hygiene good   Neck supple and no masses   Cardiovascular: regular rate and rhythm   Respiratory:  appears well, vitals normal, no respiratory distress, acyanotic, normal RR, chest clear, no  wheezing, crepitations, rhonchi, normal symmetric air entry   Abdomen: soft, non-tender; bowel sounds normal; no masses,  no organomegaly   Urinary: urethral meatus normal      Assessment:    Pregnancy: G1P0000 Patient Active Problem List   Diagnosis Date Noted  . Hypertension in pregnancy, antepartum 03/13/2015  . Type 2 diabetes mellitus affecting pregnancy, antepartum 03/13/2015  . Supervision of high risk pregnancy, antepartum 03/13/2015  . Hypothyroid in pregnancy, antepartum 03/13/2015        Plan:     Initial labs drawn. Prenatal vitamins. Problem list reviewed and updated. Genetic Screening discussed First Screen: ordered.  Will also need AFP  Ultrasound discussed; fetal survey: ordered.  Follow up in 3 weeks.  1.  Hypothyroid--Check TSH and start synthroid 100 mcg back (has been off for 2 months).  Check q trimester  2.  Type 2 DM--pt was taking the metformin 2 hours before lunch and 2 hours after lunch; pt now going to take both NPH and metformin q 12 hours around 8 am and 8 pm.  Harriett Sine supposed to call in one week and to MD in 3 weeks.  Fetal echo, optho, antenatal testing at 32 weeks, serial growth [place]  3.  HTN--controlled on labetalol, 24 hour urine and baseline labs; baby ASA at  12 weeks.  Skye Rodarte H. 03/13/2015

## 2015-03-14 LAB — PRENATAL PROFILE (SOLSTAS)
ANTIBODY SCREEN: NEGATIVE
BASOS ABS: 0 10*3/uL (ref 0.0–0.1)
BASOS PCT: 0 % (ref 0–1)
EOS ABS: 0.2 10*3/uL (ref 0.0–0.7)
Eosinophils Relative: 3 % (ref 0–5)
HEMATOCRIT: 36.6 % (ref 36.0–46.0)
HEMOGLOBIN: 12.2 g/dL (ref 12.0–15.0)
HEP B S AG: NEGATIVE
HIV 1&2 Ab, 4th Generation: NONREACTIVE
Lymphocytes Relative: 31 % (ref 12–46)
Lymphs Abs: 2.5 10*3/uL (ref 0.7–4.0)
MCH: 25.2 pg — AB (ref 26.0–34.0)
MCHC: 33.3 g/dL (ref 30.0–36.0)
MCV: 75.5 fL — ABNORMAL LOW (ref 78.0–100.0)
MPV: 9 fL (ref 8.6–12.4)
Monocytes Absolute: 0.6 10*3/uL (ref 0.1–1.0)
Monocytes Relative: 8 % (ref 3–12)
NEUTROS ABS: 4.7 10*3/uL (ref 1.7–7.7)
NEUTROS PCT: 58 % (ref 43–77)
Platelets: 463 10*3/uL — ABNORMAL HIGH (ref 150–400)
RBC: 4.85 MIL/uL (ref 3.87–5.11)
RDW: 15.5 % (ref 11.5–15.5)
RUBELLA: 1.74 {index} — AB (ref <0.90)
Rh Type: POSITIVE
WBC: 8.1 10*3/uL (ref 4.0–10.5)

## 2015-03-14 LAB — GC/CHLAMYDIA PROBE AMP (~~LOC~~) NOT AT ARMC
CHLAMYDIA, DNA PROBE: NEGATIVE
Neisseria Gonorrhea: NEGATIVE

## 2015-03-14 LAB — CYTOLOGY - PAP

## 2015-03-15 LAB — HEMOGLOBINOPATHY EVALUATION
HEMOGLOBIN OTHER: 0 %
HGB A2 QUANT: 2.4 % (ref 2.2–3.2)
HGB A: 97.6 % (ref 96.8–97.8)
HGB S QUANTITAION: 0 %
Hgb F Quant: 0 % (ref 0.0–2.0)

## 2015-03-16 LAB — CULTURE, OB URINE

## 2015-03-17 LAB — FENTANYL (GC/LC/MS), URINE
FENTANYL (GC/MS) CONFIRM: NEGATIVE ng/mL (ref <0.5)
NORFENTANYL (GC/MS) CONFIRM: NEGATIVE ng/mL (ref <0.5)

## 2015-03-18 LAB — PRESCRIPTION MONITORING PROFILE (19 PANEL)
AMPHETAMINE/METH: NEGATIVE ng/mL
BARBITURATE SCREEN, URINE: NEGATIVE ng/mL
BENZODIAZEPINE SCREEN, URINE: NEGATIVE ng/mL
Buprenorphine, Urine: NEGATIVE ng/mL
CREATININE, URINE: 35.64 mg/dL (ref >20.0)
Cannabinoid Scrn, Ur: NEGATIVE ng/mL
Carisoprodol, Urine: NEGATIVE ng/mL
Cocaine Metabolites: NEGATIVE ng/mL
ECSTASY: NEGATIVE ng/mL
METHADONE SCREEN, URINE: NEGATIVE ng/mL
METHAQUALONE SCREEN (URINE): NEGATIVE ng/mL
Meperidine, Ur: NEGATIVE ng/mL
NITRITES URINE, INITIAL: NEGATIVE ug/mL
Opiate Screen, Urine: NEGATIVE ng/mL
Oxycodone Screen, Ur: NEGATIVE ng/mL
Phencyclidine, Ur: NEGATIVE ng/mL
Propoxyphene: NEGATIVE ng/mL
Tapentadol, urine: NEGATIVE ng/mL
Tramadol Scrn, Ur: NEGATIVE ng/mL
ZOLPIDEM, URINE: NEGATIVE ng/mL
pH, Initial: 5.7 pH (ref 4.5–8.9)

## 2015-03-20 ENCOUNTER — Telehealth: Payer: Self-pay | Admitting: *Deleted

## 2015-03-20 ENCOUNTER — Encounter: Payer: Self-pay | Admitting: Obstetrics & Gynecology

## 2015-03-22 ENCOUNTER — Encounter (HOSPITAL_COMMUNITY): Payer: Self-pay | Admitting: General Practice

## 2015-03-29 ENCOUNTER — Encounter: Payer: Self-pay | Admitting: Obstetrics and Gynecology

## 2015-03-30 ENCOUNTER — Inpatient Hospital Stay (HOSPITAL_COMMUNITY)
Admission: AD | Admit: 2015-03-30 | Discharge: 2015-03-30 | Disposition: A | Discharge status: Home / Self Care | Payer: Medicaid Other | Source: Ambulatory Visit | Attending: Obstetrics & Gynecology | Admitting: Obstetrics & Gynecology

## 2015-03-30 ENCOUNTER — Encounter (HOSPITAL_COMMUNITY): Payer: Self-pay | Admitting: *Deleted

## 2015-03-30 ENCOUNTER — Telehealth: Payer: Self-pay | Admitting: *Deleted

## 2015-03-30 DIAGNOSIS — B9689 Other specified bacterial agents as the cause of diseases classified elsewhere: Secondary | ICD-10-CM | POA: Insufficient documentation

## 2015-03-30 DIAGNOSIS — O23591 Infection of other part of genital tract in pregnancy, first trimester: Secondary | ICD-10-CM

## 2015-03-30 DIAGNOSIS — N760 Acute vaginitis: Secondary | ICD-10-CM

## 2015-03-30 DIAGNOSIS — R319 Hematuria, unspecified: Secondary | ICD-10-CM | POA: Diagnosis present

## 2015-03-30 DIAGNOSIS — Z7982 Long term (current) use of aspirin: Secondary | ICD-10-CM | POA: Insufficient documentation

## 2015-03-30 DIAGNOSIS — A499 Bacterial infection, unspecified: Secondary | ICD-10-CM | POA: Diagnosis not present

## 2015-03-30 DIAGNOSIS — Z3A12 12 weeks gestation of pregnancy: Secondary | ICD-10-CM | POA: Diagnosis not present

## 2015-03-30 DIAGNOSIS — O4691 Antepartum hemorrhage, unspecified, first trimester: Secondary | ICD-10-CM | POA: Diagnosis not present

## 2015-03-30 DIAGNOSIS — Z7984 Long term (current) use of oral hypoglycemic drugs: Secondary | ICD-10-CM | POA: Insufficient documentation

## 2015-03-30 DIAGNOSIS — O209 Hemorrhage in early pregnancy, unspecified: Secondary | ICD-10-CM

## 2015-03-30 LAB — URINALYSIS, ROUTINE W REFLEX MICROSCOPIC
Bilirubin Urine: NEGATIVE
GLUCOSE, UA: NEGATIVE mg/dL
Ketones, ur: NEGATIVE mg/dL
Leukocytes, UA: NEGATIVE
Nitrite: NEGATIVE
PROTEIN: NEGATIVE mg/dL
Specific Gravity, Urine: <1.005 — ABNORMAL LOW (ref 1.005–1.030)
pH: 6.5 (ref 5.0–8.0)

## 2015-03-30 LAB — WET PREP, GENITAL
Sperm: NONE SEEN
Trich, Wet Prep: NONE SEEN
YEAST WET PREP: NONE SEEN

## 2015-03-30 LAB — URINE MICROSCOPIC-ADD ON

## 2015-03-30 MED ORDER — METRONIDAZOLE 500 MG PO TABS: 500.0000 mg | ORAL_TABLET | Freq: Two times a day (BID) | ORAL | Status: DC

## 2015-03-30 NOTE — MAU Note (Signed)
Pt states she went to restroom today & noticed blood in her urine.  Pt does not think it was vaginal bleeding.  Denies pain.

## 2015-03-30 NOTE — Telephone Encounter (Signed)
Jillian Hanson called this am and left a message she recently went to the ER at home for vaginal bleeding and had an US done. States they told her they saw a small fibroid and changed her due date. Also c/o thinks her thyroid is swollen and didn't take her thyroid medicine today.    I called Jillian Hanson and she reports she went to the ER in [place] for bleeding and states they told everything was ok, but saw a small fibroid and she should tell her doctor.  She denies any bleeding currently. I informed her our doctor would like to review that ultrasound and records to see what they did. I asked her to come by clinic to sign a release so we can get those records before her next ob visit with [place]. She states she may do that when she has an [place] in MFM soon.  She also c/o sore throat last night and felt like her thyroid was swollen so she didn't take the medicine this am.  States swelling is gone. She also c/o sore ear. I reviewed her chart and she had a TSH done at her visit 2 weeks ago and it was normal. We discussed she may have swollen lymph nodes and that may be what she felt since she had sore throat and ear pain- we discussed flu/cold/allegies present right now.  I advised her to continue taking her medicine . Keep appointments as scheduled.

## 2015-03-30 NOTE — MAU Provider Note (Signed)
History     CSN: [id number]  Arrival date and time: 03/30/15 1221   First Provider Initiated Contact with Patient 03/30/15 1303      Chief Complaint  Patient presents with  . Hematuria   HPI  [name] is a 30 y.o. female G1P0000 at 100w1d presenting to MAU with hematuria/? Vaginal bleeding. She noticed bleeding today in the toilet and then again on her toilet paper when she wiped.  She denies pain.   No recent intercourse   OB History    Gravida Para Term Preterm AB TAB SAB Ectopic Multiple Living        Past Medical History  Diagnosis Date  . Hypertension   . Diabetes mellitus without complication (HCC)   . Hypothyroidism     Past Surgical History  Procedure Laterality Date  . No past surgeries      Family History  Problem Relation Age of Onset  . Diabetes Mother   . Heart disease Father   . Asthma Brother     Social History  Substance Use Topics  . Smoking status: Never Smoker   . Smokeless tobacco: Never Used  . Alcohol Use: No    Allergies:  Allergies  Allergen Reactions  . Peanut-Containing Drug Products Anaphylaxis    Prescriptions prior to admission  Medication Sig Dispense Refill Last Dose  . aspirin EC 81 MG tablet Take 1 tablet (81 mg total) by mouth daily. Start at 12 weeks 30 tablet 12   . insulin NPH Human (HUMULIN N,NOVOLIN N) 100 UNIT/ML injection NPH ReliOn Insulin administer 8 units before breakfast and 9 units in the evening for T2DM complicated by pregnancy O24.119 10 mL 10   . Insulin Syringe-Needle U-100 (INSULIN SYRINGE 1CC/31GX5/16") 31G X 5/16" 1 ML MISC 1 each by Does not apply route 2 (two) times daily. DX T2DM complicated by pregnancy O24.119 for inject 2 times daily 100 each 10 Taking  . labetalol (NORMODYNE) 100 MG tablet Take 1 tablet (100 mg total) by mouth 2 (two) times daily. 60 tablet 2 Taking  . levothyroxine (SYNTHROID) 100 MCG tablet Take 1 tablet (100 mcg total) by mouth daily before  breakfast. 30 tablet 6   . metFORMIN (GLUCOPHAGE) 500 MG tablet Take 1 tablet (500 mg total) by mouth 2 (two) times daily with a meal. 60 tablet 10 Taking  . Prenatal Vit-Fe Fumarate-FA (PRENATAL MULTIVITAMIN) TABS tablet Take 1 tablet by mouth daily at 12 noon.   Taking   Results for orders placed or performed during the hospital encounter of 03/30/15 (from the past 24 hour(s))  Urinalysis, Routine w reflex microscopic (not at [facility])     Status: Abnormal   Collection Time: 03/30/15 12:35 PM  Result Value Ref Range   Color, Urine YELLOW YELLOW   APPearance CLEAR CLEAR   Specific Gravity, Urine <1.005 (L) 1.005 - 1.030   pH 6.5 5.0 - 8.0   Glucose, UA NEGATIVE NEGATIVE mg/dL   Hgb urine dipstick SMALL (A) NEGATIVE   Bilirubin Urine NEGATIVE NEGATIVE   Ketones, ur NEGATIVE NEGATIVE mg/dL   Protein, ur NEGATIVE NEGATIVE mg/dL   Nitrite NEGATIVE NEGATIVE   Leukocytes, UA NEGATIVE NEGATIVE  Urine microscopic-add on     Status: Abnormal   Collection Time: 03/30/15 12:35 PM  Result Value Ref Range   Squamous Epithelial / LPF 0-5 (A) NONE SEEN   WBC, UA 0-5 0 - 5 WBC/hpf   RBC / HPF 0-5  0 - 5 RBC/hpf   Bacteria, UA RARE (A) NONE SEEN  Wet prep, genital     Status: Abnormal   Collection Time: 03/30/15  1:15 PM  Result Value Ref Range   Yeast Wet Prep HPF POC NONE SEEN NONE SEEN   Trich, Wet Prep NONE SEEN NONE SEEN   Clue Cells Wet Prep HPF POC PRESENT (A) NONE SEEN   WBC, Wet Prep HPF POC FEW (A) NONE SEEN   Sperm NONE SEEN      Review of Systems  Constitutional: Negative for fever.  Gastrointestinal: Negative for nausea, vomiting and abdominal pain.  Genitourinary: Negative for dysuria, urgency and frequency.   Physical Exam   Blood pressure 133/76, pulse 115, temperature 98.3 F (36.8 C), temperature source Oral, resp. rate 18, last menstrual period 12/21/2014.  Physical Exam  Constitutional: She is oriented to person, place, and time. She appears well-developed and  well-nourished. No distress.  HENT:  Head: Normocephalic.  Eyes: Pupils are equal, round, and reactive to light.  Respiratory: Effort normal.  GI: Soft.  Genitourinary:  Speculum exam: Vagina - scant amount of pink discharge in the vagina. Cervix - No contact bleeding, no active bleeding  Bimanual exam: Cervix closed GC/Chlam, wet prep done Chaperone present for exam.  Musculoskeletal: Normal range of motion.  Neurological: She is alert and oriented to person, place, and time.  Skin: Skin is warm. She is not diaphoretic.  Psychiatric: Her behavior is normal.    MAU Course  Procedures  None  MDM  B positive blood type UA.  + fetal hear tones.   Urine culture pending   Assessment and Plan   A:  1. Vaginal bleeding in pregnancy, first trimester   2. BV (bacterial vaginosis)     P:  Discharge home in stable condition RX: Flagyl  Pelvic rest Follow up as needed Bleeding precautions Patient has [place] scheduled on March 5    Duane Lope, NP' 03/30/2015 1:59 PM

## 2015-03-30 NOTE — Discharge Instructions (Signed)
Pelvic Rest °Pelvic rest is sometimes recommended for women when:  °· The placenta is partially or completely covering the opening of the cervix (placenta previa). °· There is bleeding between the uterine wall and the amniotic sac in the first trimester (subchorionic hemorrhage). °· The cervix begins to open without labor starting (incompetent cervix, cervical insufficiency). °· The labor is too early (preterm labor). °HOME CARE INSTRUCTIONS °· Do not have sexual intercourse, stimulation, or an orgasm. °· Do not use tampons, douche, or put anything in the vagina. °· Do not lift anything over 10 pounds (4.5 kg). °· Avoid strenuous activity or straining your pelvic muscles. °SEEK MEDICAL CARE IF:  °· You have any vaginal bleeding during pregnancy. Treat this as a potential emergency. °· You have cramping pain felt low in the stomach (stronger than menstrual cramps). °· You notice vaginal discharge (watery, mucus, or bloody). °· You have a low, dull backache. °· There are regular contractions or uterine tightening. °SEEK IMMEDIATE MEDICAL CARE IF: °You have vaginal bleeding and have placenta previa.  °  °This information is not intended to replace advice given to you by your health care provider. Make sure you discuss any questions you have with your health care provider. °  °Document Released: 05/11/2010 Document Revised: 04/08/2011 Document Reviewed: 07/18/2014 °Elsevier Interactive Patient Education ©2016 Else[employer] Inc[employer] ° °Vaginal Bleeding During Pregnancy, First Trimester °A small amount of bleeding (spotting) from the vagina is common in early pregnancy. Sometimes the bleeding is normal and is not a problem, and sometimes it is a sign of something serious. Be sure to tell your doctor about any bleeding from your vagina right away. °HOME CARE °· Watch your condition for any changes. °· Follow your doctor's instructions about how active you can be. °· If you are on bed rest: °¨ You may need to stay in bed and only  get up to use the bathroom. °¨ You may be allowed to do some activities. °¨ If you need help, make plans for someone to help you. °· Write down: °¨ The number of pads you use each day. °¨ How often you change pads. °¨ How soaked (saturated) your pads are. °· Do not use tampons. °· Do not douche. °· Do not have sex or orgasms until your doctor says it is okay. °· If you pass any tissue from your vagina, save the tissue so you can show it to your doctor. °· Only take medicines as told by your doctor. °· Do not take aspirin because it can make you bleed. °· Keep all follow-up visits as told by your doctor. °GET HELP IF:  °· You bleed from your vagina. °· You have cramps. °· You have labor pains. °· You have a fever that does not go away after you take medicine. °GET HELP RIGHT AWAY IF:  °· You have very bad cramps in your back or belly (abdomen). °· You pass large clots or tissue from your vagina. °· You bleed more. °· You feel light-headed or weak. °· You pass out (faint). °· You have chills. °· You are leaking fluid or have a gush of fluid from your vagina. °· You pass out while pooping (having a bowel movement). °MAKE SURE YOU: °· Understand these instructions. °· Will watch your condition. °· Will get help right away if you are not doing well or get worse. °  °This information is not intended to replace advice given to you by your health care provider. Make sure you discuss any questions   you have with your health care provider. °  °Document Released: 05/31/2013 Document Reviewed: 05/31/2013 °Elsevier Interactive Patient Education ©2016 Elsevier Inc. ° °

## 2015-03-31 LAB — CULTURE, OB URINE: Special Requests: NORMAL

## 2015-03-31 LAB — GC/CHLAMYDIA PROBE AMP (~~LOC~~) NOT AT ARMC
Chlamydia: NEGATIVE
NEISSERIA GONORRHEA: NEGATIVE

## 2015-04-03 ENCOUNTER — Other Ambulatory Visit: Payer: Self-pay | Admitting: General Practice

## 2015-04-03 ENCOUNTER — Encounter (HOSPITAL_COMMUNITY): Payer: Self-pay

## 2015-04-03 ENCOUNTER — Ambulatory Visit (HOSPITAL_COMMUNITY): Admission: RE | Admit: 2015-04-03 | Payer: Medicaid Other | Source: Ambulatory Visit

## 2015-04-03 ENCOUNTER — Ambulatory Visit (HOSPITAL_COMMUNITY)
Admission: RE | Admit: 2015-04-03 | Discharge: 2015-04-03 | Disposition: A | Discharge status: Home / Self Care | Payer: Medicaid Other | Source: Ambulatory Visit | Attending: Obstetrics & Gynecology | Admitting: Obstetrics & Gynecology

## 2015-04-03 DIAGNOSIS — Z3A12 12 weeks gestation of pregnancy: Secondary | ICD-10-CM | POA: Insufficient documentation

## 2015-04-03 DIAGNOSIS — O10011 Pre-existing essential hypertension complicating pregnancy, first trimester: Secondary | ICD-10-CM | POA: Insufficient documentation

## 2015-04-03 DIAGNOSIS — O161 Unspecified maternal hypertension, first trimester: Secondary | ICD-10-CM

## 2015-04-03 DIAGNOSIS — Z36 Encounter for antenatal screening of mother: Secondary | ICD-10-CM | POA: Diagnosis not present

## 2015-04-03 DIAGNOSIS — E039 Hypothyroidism, unspecified: Secondary | ICD-10-CM | POA: Diagnosis not present

## 2015-04-03 DIAGNOSIS — O99281 Endocrine, nutritional and metabolic diseases complicating pregnancy, first trimester: Secondary | ICD-10-CM | POA: Insufficient documentation

## 2015-04-03 DIAGNOSIS — O99211 Obesity complicating pregnancy, first trimester: Secondary | ICD-10-CM | POA: Insufficient documentation

## 2015-04-03 DIAGNOSIS — O24911 Unspecified diabetes mellitus in pregnancy, first trimester: Secondary | ICD-10-CM | POA: Diagnosis not present

## 2015-04-05 ENCOUNTER — Encounter (HOSPITAL_COMMUNITY): Payer: Self-pay | Admitting: *Deleted

## 2015-04-05 ENCOUNTER — Telehealth: Payer: Self-pay | Admitting: *Deleted

## 2015-04-05 ENCOUNTER — Emergency Department (HOSPITAL_COMMUNITY)
Admission: EM | Admit: 2015-04-05 | Discharge: 2015-04-05 | Discharge status: Against Medical Advice | Payer: Medicaid Other | Attending: Emergency Medicine | Admitting: Emergency Medicine

## 2015-04-05 DIAGNOSIS — O10011 Pre-existing essential hypertension complicating pregnancy, first trimester: Secondary | ICD-10-CM | POA: Diagnosis not present

## 2015-04-05 DIAGNOSIS — J069 Acute upper respiratory infection, unspecified: Secondary | ICD-10-CM | POA: Diagnosis not present

## 2015-04-05 DIAGNOSIS — O24111 Pre-existing diabetes mellitus, type 2, in pregnancy, first trimester: Secondary | ICD-10-CM | POA: Diagnosis not present

## 2015-04-05 DIAGNOSIS — O99511 Diseases of the respiratory system complicating pregnancy, first trimester: Secondary | ICD-10-CM | POA: Diagnosis present

## 2015-04-05 DIAGNOSIS — Z3A13 13 weeks gestation of pregnancy: Secondary | ICD-10-CM | POA: Diagnosis not present

## 2015-04-05 NOTE — ED Notes (Signed)
Pt c/o congestion . NP cough and facial pressure; pt states that she is [redacted] weeks pregnant and does not know what she can take

## 2015-04-05 NOTE — Telephone Encounter (Signed)
Patient called and stated that she is having nasal congestion and cough. I advised patient that she should try using claritin and robitussin for cough. Advised patient to avoid pseudoephedrine and phenyephrine. Patient voiced understanding and had no further questions.

## 2015-04-08 ENCOUNTER — Emergency Department (HOSPITAL_COMMUNITY)
Admission: EM | Admit: 2015-04-08 | Discharge: 2015-04-08 | Disposition: A | Discharge status: Home / Self Care | Payer: Medicaid Other | Attending: Emergency Medicine | Admitting: Emergency Medicine

## 2015-04-08 ENCOUNTER — Encounter (HOSPITAL_COMMUNITY): Payer: Self-pay | Admitting: *Deleted

## 2015-04-08 ENCOUNTER — Encounter: Payer: Self-pay | Admitting: Obstetrics & Gynecology

## 2015-04-08 DIAGNOSIS — Z794 Long term (current) use of insulin: Secondary | ICD-10-CM | POA: Diagnosis not present

## 2015-04-08 DIAGNOSIS — Z79899 Other long term (current) drug therapy: Secondary | ICD-10-CM | POA: Insufficient documentation

## 2015-04-08 DIAGNOSIS — O99341 Other mental disorders complicating pregnancy, first trimester: Secondary | ICD-10-CM | POA: Diagnosis not present

## 2015-04-08 DIAGNOSIS — Z7982 Long term (current) use of aspirin: Secondary | ICD-10-CM | POA: Insufficient documentation

## 2015-04-08 DIAGNOSIS — F419 Anxiety disorder, unspecified: Secondary | ICD-10-CM | POA: Diagnosis not present

## 2015-04-08 DIAGNOSIS — Z3A13 13 weeks gestation of pregnancy: Secondary | ICD-10-CM | POA: Diagnosis not present

## 2015-04-08 DIAGNOSIS — O10011 Pre-existing essential hypertension complicating pregnancy, first trimester: Secondary | ICD-10-CM | POA: Insufficient documentation

## 2015-04-08 DIAGNOSIS — O9989 Other specified diseases and conditions complicating pregnancy, childbirth and the puerperium: Secondary | ICD-10-CM | POA: Diagnosis present

## 2015-04-08 DIAGNOSIS — O24111 Pre-existing diabetes mellitus, type 2, in pregnancy, first trimester: Secondary | ICD-10-CM | POA: Diagnosis not present

## 2015-04-08 DIAGNOSIS — O99281 Endocrine, nutritional and metabolic diseases complicating pregnancy, first trimester: Secondary | ICD-10-CM | POA: Insufficient documentation

## 2015-04-08 DIAGNOSIS — E039 Hypothyroidism, unspecified: Secondary | ICD-10-CM | POA: Diagnosis not present

## 2015-04-08 DIAGNOSIS — Z792 Long term (current) use of antibiotics: Secondary | ICD-10-CM | POA: Diagnosis not present

## 2015-04-08 DIAGNOSIS — Z7984 Long term (current) use of oral hypoglycemic drugs: Secondary | ICD-10-CM | POA: Insufficient documentation

## 2015-04-08 LAB — I-STAT CHEM 8, ED
BUN: 3 mg/dL — ABNORMAL LOW (ref 6–20)
CHLORIDE: 101 mmol/L (ref 101–111)
CREATININE: 0.5 mg/dL (ref 0.44–1.00)
Calcium, Ion: 1.17 mmol/L (ref 1.12–1.23)
Glucose, Bld: 102 mg/dL — ABNORMAL HIGH (ref 65–99)
HEMATOCRIT: 38 % (ref 36.0–46.0)
HEMOGLOBIN: 12.9 g/dL (ref 12.0–15.0)
POTASSIUM: 3.7 mmol/L (ref 3.5–5.1)
Sodium: 138 mmol/L (ref 135–145)
TCO2: 22 mmol/L (ref 0–100)

## 2015-04-08 LAB — I-STAT TROPONIN, ED: Troponin i, poc: 0.01 ng/mL (ref 0.00–0.08)

## 2015-04-08 LAB — CBG MONITORING, ED: Glucose-Capillary: 91 mg/dL (ref 65–99)

## 2015-04-08 NOTE — Discharge Instructions (Signed)
Generalized Anxiety Disorder Generalized anxiety disorder (GAD) is a mental disorder. It interferes with life functions, including relationships, work, and school. GAD is different from normal anxiety, which everyone experiences at some point in their lives in response to specific life events and activities. Normal anxiety actually helps us prepare for and get through these life events and activities. Normal anxiety goes away after the event or activity is over.  GAD causes anxiety that is not necessarily related to specific events or activities. It also causes excess anxiety in proportion to specific events or activities. The anxiety associated with GAD is also difficult to control. GAD can vary from mild to severe. People with severe GAD can have intense waves of anxiety with physical symptoms (panic attacks).  SYMPTOMS The anxiety and worry associated with GAD are difficult to control. This anxiety and worry are related to many life events and activities and also occur more days than not for 6 months or longer. People with GAD also have three or more of the following symptoms (one or more in children):  Restlessness.   Fatigue.  Difficulty concentrating.   Irritability.  Muscle tension.  Difficulty sleeping or unsatisfying sleep. DIAGNOSIS GAD is diagnosed through an assessment by your health care provider. Your health care provider will ask you questions aboutyour mood,physical symptoms, and events in your life. Your health care provider may ask you about your medical history and use of alcohol or drugs, including prescription medicines. Your health care provider may also do a physical exam and blood tests. Certain medical conditions and the use of certain substances can cause symptoms similar to those associated with GAD. Your health care provider may refer you to a mental health specialist for further evaluation. TREATMENT The following therapies are usually used to treat GAD:    Medication. Antidepressant medication usually is prescribed for long-term daily control. Antianxiety medicines may be added in severe cases, especially when panic attacks occur.   Talk therapy (psychotherapy). Certain types of talk therapy can be helpful in treating GAD by providing support, education, and guidance. A form of talk therapy called cognitive behavioral therapy can teach you healthy ways to think about and react to daily life events and activities.  Stress managementtechniques. These include yoga, meditation, and exercise and can be very helpful when they are practiced regularly. A mental health specialist can help determine which treatment is best for you. Some people see improvement with one therapy. However, other people require a combination of therapies.   This information is not intended to replace advice given to you by your health care provider. Make sure you discuss any questions you have with your health care provider.   Document Released: 05/11/2012 Document Revised: 02/04/2014 Document Reviewed: 05/11/2012 Elsevier Interactive Patient Education 2016 Elsevier Inc.  

## 2015-04-08 NOTE — ED Provider Notes (Signed)
CSN: [id number]     Arrival date & time 04/08/15  4098 History   First MD Initiated Contact with Patient 04/08/15 [phone]     Chief Complaint  Patient presents with  . Anxiety  . Palpitations     (Consider location/radiation/quality/duration/timing/severity/associated sxs/prior Treatment) HPI Comments: Patient here complaining of increased anxiety and feeling shaky. History of anxiety attack in the past and this is similar. Has had palpitations as well as URI symptoms. She is currently [redacted] weeks pregnant and is having ultrasound confirms this. Denies any vaginal bleeding or discharge. No abdominal cramping. States that she is a trouble sleeping due to nasal congestion. Called her Dr. who told her to take Benadryl which she has not done yet. She is a diabetic but denies any polyuria or polydipsia currently. Does have a history of hypertension but is not use her daily hypertensive med at this time. Had some palpitations without pleuritic chest pain. No leg swelling. Symptoms are made worse when she thinks about her current situation. Nothing makes them better  Patient is a 30 y.o. female presenting with anxiety and palpitations. The history is provided by the patient.  Anxiety  Palpitations   Past Medical History  Diagnosis Date  . Hypertension   . Diabetes mellitus without complication (HCC)   . Hypothyroidism    Past Surgical History  Procedure Laterality Date  . No past surgeries     Family History  Problem Relation Age of Onset  . Diabetes Mother   . Heart disease Father   . Asthma Brother    Social History  Substance Use Topics  . Smoking status: Never Smoker   . Smokeless tobacco: Never Used  . Alcohol Use: No   OB History    Gravida Para Term Preterm AB TAB SAB Ectopic Multiple Living       Review of Systems  Cardiovascular: Positive for palpitations.  All other systems reviewed and are negative.     Allergies  Peanut-containing drug  products  Home Medications   Prior to Admission medications   Medication Sig Start Date End Date Taking? Authorizing Provider  aspirin EC 81 MG tablet Take 1 tablet (81 mg total) by mouth daily. Start at 12 weeks 03/13/15   Lesly Dukes, MD  insulin NPH Human (HUMULIN N,NOVOLIN N) 100 UNIT/ML injection NPH ReliOn Insulin administer 8 units before breakfast and 9 units in the evening for T2DM complicated by pregnancy O24.119 03/13/15   Lesly Dukes, MD  Insulin Syringe-Needle U-100 (INSULIN SYRINGE 1CC/31GX5/16") 31G X 5/16" 1 ML MISC 1 each by Does not apply route 2 (two) times daily. DX T2DM complicated by pregnancy O24.119 for inject 2 times daily 02/27/15   Federico Flake, MD  labetalol (NORMODYNE) 100 MG tablet Take 1 tablet (100 mg total) by mouth 2 (two) times daily. 02/26/15   Dorathy Kinsman, CNM  levothyroxine (SYNTHROID) 100 MCG tablet Take 1 tablet (100 mcg total) by mouth daily before breakfast. 03/13/15   Lesly Dukes, MD  metFORMIN (GLUCOPHAGE) 500 MG tablet Take 1 tablet (500 mg total) by mouth 2 (two) times daily with a meal. 02/27/15   Federico Flake, MD  metroNIDAZOLE (FLAGYL) 500 MG tablet Take 1 tablet (500 mg total) by mouth 2 (two) times daily. 03/30/15   Duane Lope, NP  Prenatal Vit-Fe Fumarate-FA (PRENATAL MULTIVITAMIN) TABS tablet Take 1 tablet by mouth daily at 12 noon.    Historical Provider,  MD   BP 153/87 mmHg  Temp(Src) 97.6 F (36.4 C) (Oral)  Resp 18  SpO2 99%  LMP 12/21/2014 Physical Exam  Constitutional: She is oriented to person, place, and time. She appears well-developed and well-nourished.  Non-toxic appearance. No distress.  HENT:  Head: Normocephalic and atraumatic.  Eyes: Conjunctivae, EOM and lids are normal. Pupils are equal, round, and reactive to light.  Neck: Normal range of motion. Neck supple. No tracheal deviation present. No thyroid mass present.  Cardiovascular: Normal rate, regular rhythm and normal heart sounds.  Exam  reveals no gallop.   No murmur heard. Pulmonary/Chest: Effort normal and breath sounds normal. No stridor. No respiratory distress. She has no decreased breath sounds. She has no wheezes. She has no rhonchi. She has no rales.  Abdominal: Soft. Normal appearance and bowel sounds are normal. She exhibits no distension. There is no tenderness. There is no rebound and no CVA tenderness.  Musculoskeletal: Normal range of motion. She exhibits no edema or tenderness.  Neurological: She is alert and oriented to person, place, and time. She has normal strength. No cranial nerve deficit or sensory deficit. GCS eye subscore is 4. GCS verbal subscore is 5. GCS motor subscore is 6.  Skin: Skin is warm and dry. No abrasion and no rash noted.  Psychiatric: She has a normal mood and affect. Her speech is normal and behavior is normal.  Nursing note and vitals reviewed.   ED Course  Procedures (including critical care time) Labs Review Labs Reviewed  CBG MONITORING, ED    Imaging Review No results found. I have personally reviewed and evaluated these images and lab results as part of my medical decision-making.   EKG Interpretation   Date/Time:  Saturday April 08 2015 07:55:58 EST Ventricular Rate:  98 PR Interval:  139 QRS Duration: 69 QT Interval:  326 QTC Calculation: 416 R Axis:   63 Text Interpretation:  Sinus rhythm Borderline T abnormalities, inferior  leads No significant change since last tracing Confirmed by Maven Rosander  MD,  Zevin Nevares ([id number]) on 04/08/2015 8:36:44 AM      MDM   Final diagnoses:  None   Patient's blood pressure mildly elevated here. Patient has not had a normal morning meds and will take them when she gets home. EKG without evidence of tachycardia. CBG is 91. Patient encouraged to take Benadryl. Do not think that this patient's symptoms are consistent with PE or ACS. She is not in DKA. Stable for discharge     Lorre NickAnthony Koi Yarbro, MD 04/08/15 [phone]

## 2015-04-08 NOTE — ED Notes (Signed)
Pt ambulating independently w/ steady gait on d/c in no acute distress, A&Ox4. D/c instructions reviewed w/ pt - denies any further questions or concerns at present.  

## 2015-04-08 NOTE — ED Notes (Signed)
Pt reports experiencing a panic attack yesterday evening, pt w/ a hx of panic attacks however has not experienced one in approx 2262yrs. Pt was experiencing palpitations, "heart was racing and going to pound out of my chest" and feeling "shaky" - pt called her PCP and the on call RN encouraged pt to seek medical attention should she continue to experience her symptoms. Pt currently denies chest pain or shortness of breath, admits to continued "heart racing and shaky." Pt admits she is approx [redacted]weeks pregnant (LNMP 12/21/14) and no new changes to her medications.

## 2015-04-10 ENCOUNTER — Ambulatory Visit (INDEPENDENT_AMBULATORY_CARE_PROVIDER_SITE_OTHER): Payer: Medicaid Other | Admitting: Obstetrics and Gynecology

## 2015-04-10 ENCOUNTER — Encounter (HOSPITAL_COMMUNITY): Payer: Self-pay

## 2015-04-10 ENCOUNTER — Encounter: Payer: Self-pay | Admitting: Obstetrics and Gynecology

## 2015-04-10 ENCOUNTER — Telehealth: Payer: Self-pay

## 2015-04-10 ENCOUNTER — Ambulatory Visit (HOSPITAL_COMMUNITY)
Admission: RE | Admit: 2015-04-10 | Discharge: 2015-04-10 | Disposition: A | Discharge status: Home / Self Care | Payer: Medicaid Other | Source: Ambulatory Visit | Attending: Obstetrics & Gynecology | Admitting: Obstetrics & Gynecology

## 2015-04-10 ENCOUNTER — Ambulatory Visit (HOSPITAL_COMMUNITY)
Admission: RE | Admit: 2015-04-10 | Discharge: 2015-04-10 | Disposition: A | Discharge status: Home / Self Care | Payer: Medicaid Other | Source: Ambulatory Visit | Attending: Obstetrics and Gynecology | Admitting: Obstetrics and Gynecology

## 2015-04-10 ENCOUNTER — Other Ambulatory Visit: Payer: Self-pay | Admitting: General Practice

## 2015-04-10 VITALS — BP 130/77 | HR 104 | Wt 253.0 lb

## 2015-04-10 VITALS — BP 138/92 | HR 116 | Wt 254.0 lb

## 2015-04-10 DIAGNOSIS — O24119 Pre-existing diabetes mellitus, type 2, in pregnancy, unspecified trimester: Secondary | ICD-10-CM | POA: Diagnosis present

## 2015-04-10 DIAGNOSIS — Z3A13 13 weeks gestation of pregnancy: Secondary | ICD-10-CM

## 2015-04-10 DIAGNOSIS — Z3689 Encounter for other specified antenatal screening: Secondary | ICD-10-CM

## 2015-04-10 DIAGNOSIS — E039 Hypothyroidism, unspecified: Secondary | ICD-10-CM

## 2015-04-10 DIAGNOSIS — O24911 Unspecified diabetes mellitus in pregnancy, first trimester: Secondary | ICD-10-CM | POA: Diagnosis not present

## 2015-04-10 DIAGNOSIS — O99281 Endocrine, nutritional and metabolic diseases complicating pregnancy, first trimester: Secondary | ICD-10-CM | POA: Diagnosis not present

## 2015-04-10 DIAGNOSIS — O99282 Endocrine, nutritional and metabolic diseases complicating pregnancy, second trimester: Secondary | ICD-10-CM

## 2015-04-10 DIAGNOSIS — O10011 Pre-existing essential hypertension complicating pregnancy, first trimester: Secondary | ICD-10-CM | POA: Insufficient documentation

## 2015-04-10 DIAGNOSIS — O162 Unspecified maternal hypertension, second trimester: Secondary | ICD-10-CM

## 2015-04-10 DIAGNOSIS — Z36 Encounter for antenatal screening of mother: Secondary | ICD-10-CM | POA: Insufficient documentation

## 2015-04-10 DIAGNOSIS — O0992 Supervision of high risk pregnancy, unspecified, second trimester: Secondary | ICD-10-CM

## 2015-04-10 DIAGNOSIS — O99211 Obesity complicating pregnancy, first trimester: Secondary | ICD-10-CM | POA: Insufficient documentation

## 2015-04-10 DIAGNOSIS — Z3682 Encounter for antenatal screening for nuchal translucency: Secondary | ICD-10-CM

## 2015-04-10 LAB — POCT URINALYSIS DIP (DEVICE)
Bilirubin Urine: NEGATIVE
GLUCOSE, UA: NEGATIVE mg/dL
Hgb urine dipstick: NEGATIVE
Ketones, ur: NEGATIVE mg/dL
LEUKOCYTES UA: NEGATIVE
NITRITE: NEGATIVE
Protein, ur: NEGATIVE mg/dL
Specific Gravity, Urine: 1.02 (ref 1.005–1.030)
UROBILINOGEN UA: 0.2 mg/dL (ref 0.0–1.0)
pH: 5.5 (ref 5.0–8.0)

## 2015-04-10 NOTE — Telephone Encounter (Signed)
Pt called and spoke with Jillian CahillSusan Hanson (registrar) after leaving clinic appt this morning. She requested a call back regarding medications and a question that she had for the physician. I called pt and discussed her concern. She stated that she had been seen @ the hospital on 3/10 because of a panic attack. She is still trembling slightly and wants to know if there is something she can take or something she can do. I asked pt if she has checked her blood sugar and she responded that it is normal. She just feels a little shaky and also has trouble sleeping at night because of this feeling. Per chart review, pt was recently started on Levothyroxine on 2/13 and prior to that she was off this medication. I advised that I will send a note to Dr. Jolayne Pantheronstant. We will call her once a response is received. Meanwhile, if pt's symptoms worsen, she should come to [facility] for evaluation. Pt voiced understanding.

## 2015-04-10 NOTE — Addendum Note (Signed)
Addended by: Sherre LainASH, AMANDA A on: 04/10/2015 10:53 AM   Modules accepted: Orders

## 2015-04-10 NOTE — Progress Notes (Signed)
Subjective:  Jillian Hanson is a 30 y.o. G1P0000 at 10343w5d being seen today for ongoing prenatal care.  She is currently monitored for the following issues for this high-risk pregnancy and has Hypertension in pregnancy, antepartum; Type 2 diabetes mellitus affecting pregnancy, antepartum; Supervision of high risk pregnancy, antepartum; and Hypothyroid in pregnancy, antepartum on her problem list.  Patient reports no complaints.  Contractions: Not present. Vag. Bleeding: None.   . Denies leaking of fluid.   The following portions of the patient's history were reviewed and updated as appropriate: allergies, current medications, past family history, past medical history, past social history, past surgical history and problem list. Problem list updated.  Objective:   Filed Vitals:   04/10/15 0806  BP: 138/92  Pulse: 116  Weight: 254 lb (115.214 kg)    Fetal Status: Fetal Heart Rate (bpm): 160         General:  Alert, oriented and cooperative. Patient is in no acute distress.  Skin: Skin is warm and dry. No rash noted.   Cardiovascular: Normal heart rate noted  Respiratory: Normal respiratory effort, no problems with respiration noted  Abdomen: Soft, gravid, appropriate for gestational age. Pain/Pressure: Absent     Pelvic: Vag. Bleeding: None     Cervical exam deferred        Extremities: Normal range of motion.  Edema: None  Mental Status: Normal mood and affect. Normal behavior. Normal judgment and thought content.   Urinalysis:      Assessment and Plan:  Pregnancy: G1P0000 at 2643w5d  1. Type 2 diabetes mellitus affecting pregnancy, antepartum CBGs reviewed and all within range.  Encouraged the patient to continue current diet She has not been able to collect 24 hour urine but plans on bringing it in next monday - CBC - Comprehensive metabolic panel - Hemoglobin A1c - Protein, urine, 24 hour  2. Supervision of high risk pregnancy, antepartum, second trimester First trimester  screen today  3. Hypothyroid in pregnancy, antepartum, second trimester Continue synthroid  4. Hypertension in pregnancy, antepartum, second trimester Continue labetalol and ASA  General obstetric precautions including but not limited to vaginal bleeding, contractions, leaking of fluid and fetal movement were reviewed in detail with the patient. Please refer to After Visit Summary for other counseling recommendations.  Return in about 3 weeks (around 05/01/2015).   Catalina AntiguaPeggy Taaj Hurlbut, MD

## 2015-04-10 NOTE — Progress Notes (Signed)
Pt has not taken bp meds yet this morning.

## 2015-04-11 ENCOUNTER — Telehealth: Payer: Self-pay | Admitting: *Deleted

## 2015-04-11 NOTE — Telephone Encounter (Signed)
Jillian Hanson called and left a message last night after 5pm stating she talked to the doctor about getting antibiotics for her cold, but the doctor wanted her to wait until Friday.  She states she wants to get the antibiotics now because it is getting harder to breathe at night and she is not sleeping well.   I called Jillian Hanson and she states she talked with the doctor about her cold, but she states actually she thinks she has turned the corner and it is starting to get better. States she is ok , she slept better last night and could breathe better. I advised her colds take 7-10 days to run their course and to call us back if it worsens. She voices understanding.

## 2015-04-17 NOTE — Telephone Encounter (Signed)
Left Message-  called to review glucose due to insulin change. Requested return my call to [phone].

## 2015-04-21 ENCOUNTER — Other Ambulatory Visit (HOSPITAL_COMMUNITY): Payer: Self-pay

## 2015-04-21 ENCOUNTER — Encounter: Payer: Self-pay | Admitting: *Deleted

## 2015-04-21 DIAGNOSIS — O0990 Supervision of high risk pregnancy, unspecified, unspecified trimester: Secondary | ICD-10-CM

## 2015-05-01 ENCOUNTER — Ambulatory Visit (INDEPENDENT_AMBULATORY_CARE_PROVIDER_SITE_OTHER): Payer: Medicaid Other | Admitting: Obstetrics & Gynecology

## 2015-05-01 ENCOUNTER — Telehealth: Payer: Self-pay | Admitting: General Practice

## 2015-05-01 ENCOUNTER — Encounter: Payer: Medicaid Other | Attending: Obstetrics & Gynecology | Admitting: *Deleted

## 2015-05-01 VITALS — BP 116/86 | HR 110 | Temp 98.9°F | Wt 250.9 lb

## 2015-05-01 DIAGNOSIS — E039 Hypothyroidism, unspecified: Secondary | ICD-10-CM

## 2015-05-01 DIAGNOSIS — O24112 Pre-existing diabetes mellitus, type 2, in pregnancy, second trimester: Secondary | ICD-10-CM | POA: Diagnosis not present

## 2015-05-01 DIAGNOSIS — O99282 Endocrine, nutritional and metabolic diseases complicating pregnancy, second trimester: Secondary | ICD-10-CM | POA: Diagnosis not present

## 2015-05-01 DIAGNOSIS — E119 Type 2 diabetes mellitus without complications: Secondary | ICD-10-CM | POA: Diagnosis not present

## 2015-05-01 DIAGNOSIS — O24419 Gestational diabetes mellitus in pregnancy, unspecified control: Secondary | ICD-10-CM | POA: Diagnosis present

## 2015-05-01 DIAGNOSIS — O0992 Supervision of high risk pregnancy, unspecified, second trimester: Secondary | ICD-10-CM

## 2015-05-01 DIAGNOSIS — Z3A01 Less than 8 weeks gestation of pregnancy: Secondary | ICD-10-CM | POA: Insufficient documentation

## 2015-05-01 DIAGNOSIS — O132 Gestational [pregnancy-induced] hypertension without significant proteinuria, second trimester: Secondary | ICD-10-CM

## 2015-05-01 LAB — POCT URINALYSIS DIP (DEVICE)
Bilirubin Urine: NEGATIVE
Glucose, UA: NEGATIVE mg/dL
Ketones, ur: NEGATIVE mg/dL
LEUKOCYTES UA: NEGATIVE
NITRITE: NEGATIVE
PH: 7 (ref 5.0–8.0)
PROTEIN: NEGATIVE mg/dL
Specific Gravity, Urine: 1.025 (ref 1.005–1.030)
UROBILINOGEN UA: 0.2 mg/dL (ref 0.0–1.0)

## 2015-05-01 NOTE — Telephone Encounter (Signed)
Patient called and left message stating her resting heart rate is 116 and wants to know if she needs to go to the ER or not. Called patient and her sister answered stating she wasn't with her at the moment but would let her know we called

## 2015-05-01 NOTE — Progress Notes (Signed)
Went to ER in [place] and they put her on keflex for  A cyst that was I&d under arm.

## 2015-05-01 NOTE — Patient Instructions (Signed)

## 2015-05-01 NOTE — Progress Notes (Signed)
Pt present for review of glucose readings. FBS & 2hpp readings WNL no change in medication. Patient to call if she see readings increasing with advancement of pregnancy. Has appt with MD 2 weeks.

## 2015-05-01 NOTE — Progress Notes (Signed)
Subjective:  Jillian Hanson is a 30 y.o. G1P0000 at 3648w5d being seen today for ongoing prenatal care.  She is currently monitored for the following issues for this high-risk pregnancy and has Hypertension in pregnancy, antepartum; Type 2 diabetes mellitus affecting pregnancy, antepartum; Supervision of high risk pregnancy, antepartum; and Hypothyroid in pregnancy, antepartum on her problem list.  Patient reports no complaints.  Contractions: Not present. Vag. Bleeding: None.  Movement: Present. Denies leaking of fluid.   The following portions of the patient's history were reviewed and updated as appropriate: allergies, current medications, past family history, past medical history, past social history, past surgical history and problem list. Problem list updated.  Objective:   Filed Vitals:   05/01/15 1055  BP: 116/86  Pulse: 110  Temp: 98.9 F (37.2 C)  Weight: 250 lb 14.4 oz (113.807 kg)    Fetal Status: Fetal Heart Rate (bpm): 164   Movement: Present     General:  Alert, oriented and cooperative. Patient is in no acute distress.  Skin: Skin is warm and dry. No rash noted.   Cardiovascular: Normal heart rate noted  Respiratory: Normal respiratory effort, no problems with respiration noted  Abdomen: Soft, gravid, appropriate for gestational age. Pain/Pressure: Absent     Pelvic: Vag. Bleeding: None     Cervical exam deferred        Extremities: Normal range of motion.  Edema: None  Mental Status: Normal mood and affect. Normal behavior. Normal judgment and thought content.   Urinalysis: Urine Protein: Negative Urine Glucose: Negative  Assessment and Plan:  Pregnancy: G1P0000 at 6348w5d  1. Supervision of high risk pregnancy, antepartum, second trimester BG control is good, continue present medication  Preterm labor symptoms and general obstetric precautions including but not limited to vaginal bleeding, contractions, leaking of fluid and fetal movement were reviewed in detail with  the patient. Please refer to After Visit Summary for other counseling recommendations.  Return in about 3 weeks (around 05/22/2015).   Adam PhenixJames G Aela Bohan, MD

## 2015-05-01 NOTE — Progress Notes (Signed)
Nutrition Note: Diet Education Pt with h/o obesity, Type II DM and HTN.  Pt has gained 1.9 # @ 2859w5d, which is < expected.  Pt reports eating 3 meals and 2 snacks daily.  Pt is taking PNV.  Pt reports no N&V or heartburn. Allergic to Peanuts.  Pt reports eating healthier and walking 1 hour each day.  Pt received verbal and written education on general nutrition during pregnancy- concern for weight loss. Discussed weight gain goals of 11-20# or 0.5 lbs per week. Pt agrees to continue to take PNV.  Pt has WIC and plans to BF. F/u as needed.  Carloyn Mannerebekah Masaki Rothbauer, MS, RD,LDN

## 2015-05-03 ENCOUNTER — Telehealth: Payer: Self-pay | Admitting: *Deleted

## 2015-05-03 NOTE — Telephone Encounter (Signed)
Jillian Hanson called and left a message she needs a letter for her dentist so she can get work done.   I called Jillian Hanson and informed her she can come by office during office hours and we can give her a letter. She asked If it could be faxed and I informed her we can fax it if she signs a release of information.

## 2015-05-17 ENCOUNTER — Ambulatory Visit (HOSPITAL_COMMUNITY): Admission: RE | Admit: 2015-05-17 | Payer: Medicaid Other | Source: Ambulatory Visit

## 2015-05-22 ENCOUNTER — Ambulatory Visit (HOSPITAL_COMMUNITY)
Admission: RE | Admit: 2015-05-22 | Discharge: 2015-05-22 | Disposition: A | Discharge status: Home / Self Care | Payer: Medicaid Other | Source: Ambulatory Visit | Attending: Obstetrics & Gynecology | Admitting: Obstetrics & Gynecology

## 2015-05-22 ENCOUNTER — Other Ambulatory Visit: Payer: Self-pay | Admitting: General Practice

## 2015-05-22 ENCOUNTER — Encounter: Payer: Self-pay | Admitting: Family Medicine

## 2015-05-22 ENCOUNTER — Ambulatory Visit (INDEPENDENT_AMBULATORY_CARE_PROVIDER_SITE_OTHER): Payer: Medicaid Other | Admitting: Obstetrics and Gynecology

## 2015-05-22 ENCOUNTER — Ambulatory Visit (HOSPITAL_COMMUNITY): Payer: Self-pay

## 2015-05-22 VITALS — BP 116/89 | HR 104 | Wt 250.0 lb

## 2015-05-22 DIAGNOSIS — O10012 Pre-existing essential hypertension complicating pregnancy, second trimester: Secondary | ICD-10-CM | POA: Insufficient documentation

## 2015-05-22 DIAGNOSIS — O99212 Obesity complicating pregnancy, second trimester: Secondary | ICD-10-CM

## 2015-05-22 DIAGNOSIS — Z3689 Encounter for other specified antenatal screening: Secondary | ICD-10-CM

## 2015-05-22 DIAGNOSIS — O99282 Endocrine, nutritional and metabolic diseases complicating pregnancy, second trimester: Secondary | ICD-10-CM | POA: Diagnosis not present

## 2015-05-22 DIAGNOSIS — O161 Unspecified maternal hypertension, first trimester: Secondary | ICD-10-CM

## 2015-05-22 DIAGNOSIS — O24312 Unspecified pre-existing diabetes mellitus in pregnancy, second trimester: Secondary | ICD-10-CM | POA: Diagnosis not present

## 2015-05-22 DIAGNOSIS — Z3A19 19 weeks gestation of pregnancy: Secondary | ICD-10-CM

## 2015-05-22 DIAGNOSIS — O10019 Pre-existing essential hypertension complicating pregnancy, unspecified trimester: Secondary | ICD-10-CM

## 2015-05-22 DIAGNOSIS — R8271 Bacteriuria: Secondary | ICD-10-CM | POA: Insufficient documentation

## 2015-05-22 DIAGNOSIS — E039 Hypothyroidism, unspecified: Secondary | ICD-10-CM | POA: Insufficient documentation

## 2015-05-22 DIAGNOSIS — O24812 Other pre-existing diabetes mellitus in pregnancy, second trimester: Secondary | ICD-10-CM

## 2015-05-22 DIAGNOSIS — O0992 Supervision of high risk pregnancy, unspecified, second trimester: Secondary | ICD-10-CM

## 2015-05-22 DIAGNOSIS — O10912 Unspecified pre-existing hypertension complicating pregnancy, second trimester: Secondary | ICD-10-CM

## 2015-05-22 LAB — POCT URINALYSIS DIP (DEVICE)
BILIRUBIN URINE: NEGATIVE
Glucose, UA: NEGATIVE mg/dL
HGB URINE DIPSTICK: NEGATIVE
Ketones, ur: NEGATIVE mg/dL
LEUKOCYTES UA: NEGATIVE
Nitrite: NEGATIVE
PH: 6.5 (ref 5.0–8.0)
Protein, ur: NEGATIVE mg/dL
Specific Gravity, Urine: 1.025 (ref 1.005–1.030)
Urobilinogen, UA: 0.2 mg/dL (ref 0.0–1.0)

## 2015-05-22 NOTE — Progress Notes (Signed)
Subjective:  Jillian Hanson is a 30 y.o. G1P0000 at 6338w5d being seen today for ongoing prenatal care.  She is currently monitored for the following issues for this high-risk pregnancy and has Hypertension in pregnancy, antepartum; Type 2 diabetes mellitus affecting pregnancy, antepartum; Supervision of high risk pregnancy, antepartum; Hypothyroid in pregnancy, antepartum; and Group B streptococcal bacteriuria on her problem list.  Patient reports no complaints.  Contractions: Not present. Vag. Bleeding: None.  Movement: Present. Denies leaking of fluid.   fastings all less than 90. This week didn't check after breakfast.   The following portions of the patient's history were reviewed and updated as appropriate: allergies, current medications, past family history, past medical history, past social history, past surgical history and problem list. Problem list updated.  Objective:   Filed Vitals:   05/22/15 1015  BP: 116/89  Pulse: 104  Weight: 250 lb (113.399 kg)    Fetal Status: Fetal Heart Rate (bpm): 154   Movement: Present     General:  Alert, oriented and cooperative. Patient is in no acute distress.  Skin: Skin is warm and dry. No rash noted.   Cardiovascular: Normal heart rate noted  Respiratory: Normal respiratory effort, no problems with respiration noted  Abdomen: Soft, gravid, appropriate for gestational age. Pain/Pressure: Absent     Pelvic: Vag. Bleeding: None     Cervical exam deferred        Extremities: Normal range of motion.  Edema: None  Mental Status: Normal mood and affect. Normal behavior. Normal judgment and thought content.   Urinalysis:      Assessment and Plan:  Pregnancy: G1P0000 at 6838w5d  # CHTN - bp appropriate  # BDM - glucose appropriate - diabetes educator 2 wks, ob 4 wks - echo ordered - 24-hour urine and labs rodered today. Were not collected in march  # hypothyroid - tsh ordered  Preterm labor symptoms and general obstetric precautions  including but not limited to vaginal bleeding, contractions, leaking of fluid and fetal movement were reviewed in detail with the patient. Please refer to After Visit Summary for other counseling recommendations.    Kathrynn RunningNoah Bedford Wouk, MD

## 2015-05-22 NOTE — Progress Notes (Signed)
Fetal Echo scheduled for 05/18 @ 830am.

## 2015-05-23 LAB — COMPREHENSIVE METABOLIC PANEL
ALT: 12 U/L (ref 6–29)
AST: 14 U/L (ref 10–30)
Albumin: 3.7 g/dL (ref 3.6–5.1)
Alkaline Phosphatase: 67 U/L (ref 33–115)
BUN: 6 mg/dL — AB (ref 7–25)
CHLORIDE: 102 mmol/L (ref 98–110)
CO2: 24 mmol/L (ref 20–31)
Calcium: 9.2 mg/dL (ref 8.6–10.2)
Creat: 0.43 mg/dL — ABNORMAL LOW (ref 0.50–1.10)
Glucose, Bld: 76 mg/dL (ref 65–99)
POTASSIUM: 4.6 mmol/L (ref 3.5–5.3)
Sodium: 137 mmol/L (ref 135–146)
TOTAL PROTEIN: 7.2 g/dL (ref 6.1–8.1)
Total Bilirubin: 0.4 mg/dL (ref 0.2–1.2)

## 2015-05-23 LAB — PROTEIN / CREATININE RATIO, URINE
CREATININE, URINE: 175 mg/dL (ref 20–320)
PROTEIN CREATININE RATIO: 74 mg/g{creat} (ref 21–161)
Total Protein, Urine: 13 mg/dL (ref 5–24)

## 2015-05-23 LAB — TSH: TSH: 2.7 mIU/L

## 2015-05-23 LAB — HEMOGLOBIN A1C
HEMOGLOBIN A1C: 6.2 % — AB (ref <5.7)
MEAN PLASMA GLUCOSE: 131 mg/dL

## 2015-05-25 ENCOUNTER — Telehealth: Payer: Self-pay | Admitting: *Deleted

## 2015-05-25 DIAGNOSIS — O9989 Other specified diseases and conditions complicating pregnancy, childbirth and the puerperium: Secondary | ICD-10-CM

## 2015-05-25 DIAGNOSIS — M549 Dorsalgia, unspecified: Secondary | ICD-10-CM

## 2015-05-25 DIAGNOSIS — O10911 Unspecified pre-existing hypertension complicating pregnancy, first trimester: Secondary | ICD-10-CM

## 2015-05-25 DIAGNOSIS — O368390 Maternal care for abnormalities of the fetal heart rate or rhythm, unspecified trimester, not applicable or unspecified: Secondary | ICD-10-CM

## 2015-05-25 MED ORDER — LABETALOL HCL 100 MG PO TABS: 100.0000 mg | ORAL_TABLET | Freq: Two times a day (BID) | ORAL | Status: DC

## 2015-05-25 NOTE — Telephone Encounter (Signed)
Carlye Grippeiesha had left a messge a few days ago  re: issue with trying to get her insulin filled.States she was trying to get It a few days early and they said it was too soon.  Per chart not sure if issue  Corrected. I called Carlye Grippeiesha and she states she got the insulin. We discussed pharmacy can only Refill prescription when it is due and that is close to when she will run out. She states now She needs her labetolol refilled, the pharmacy states it has no refills.  She only has 2 doses.  Refill approved by Dr. Macon LargeAnyanwu.

## 2015-05-29 ENCOUNTER — Other Ambulatory Visit: Payer: Self-pay | Admitting: Advanced Practice Midwife

## 2015-05-29 ENCOUNTER — Telehealth: Payer: Self-pay | Admitting: *Deleted

## 2015-05-29 NOTE — Telephone Encounter (Signed)
Pt requesting refill on Labetalol.

## 2015-05-30 ENCOUNTER — Encounter: Payer: Self-pay | Admitting: Family

## 2015-05-30 ENCOUNTER — Encounter: Payer: Self-pay | Admitting: Advanced Practice Midwife

## 2015-05-30 ENCOUNTER — Ambulatory Visit (INDEPENDENT_AMBULATORY_CARE_PROVIDER_SITE_OTHER): Payer: Medicaid Other | Admitting: Advanced Practice Midwife

## 2015-05-30 DIAGNOSIS — E119 Type 2 diabetes mellitus without complications: Secondary | ICD-10-CM

## 2015-05-30 DIAGNOSIS — O24312 Unspecified pre-existing diabetes mellitus in pregnancy, second trimester: Secondary | ICD-10-CM | POA: Diagnosis not present

## 2015-05-30 DIAGNOSIS — E039 Hypothyroidism, unspecified: Secondary | ICD-10-CM | POA: Diagnosis not present

## 2015-05-30 DIAGNOSIS — W19XXXA Unspecified fall, initial encounter: Secondary | ICD-10-CM

## 2015-05-30 DIAGNOSIS — O99282 Endocrine, nutritional and metabolic diseases complicating pregnancy, second trimester: Secondary | ICD-10-CM

## 2015-05-30 DIAGNOSIS — O9A212 Injury, poisoning and certain other consequences of external causes complicating pregnancy, second trimester: Secondary | ICD-10-CM

## 2015-05-30 DIAGNOSIS — T1490 Injury, unspecified: Secondary | ICD-10-CM

## 2015-05-30 LAB — COMPREHENSIVE METABOLIC PANEL
ALT: 12 U/L (ref 6–29)
AST: 14 U/L (ref 10–30)
Albumin: 3.7 g/dL (ref 3.6–5.1)
Alkaline Phosphatase: 68 U/L (ref 33–115)
BUN: 5 mg/dL — AB (ref 7–25)
CHLORIDE: 99 mmol/L (ref 98–110)
CO2: 23 mmol/L (ref 20–31)
Calcium: 9.3 mg/dL (ref 8.6–10.2)
Creat: 0.58 mg/dL (ref 0.50–1.10)
Glucose, Bld: 67 mg/dL (ref 65–99)
POTASSIUM: 4.7 mmol/L (ref 3.5–5.3)
SODIUM: 134 mmol/L — AB (ref 135–146)
TOTAL PROTEIN: 7.2 g/dL (ref 6.1–8.1)
Total Bilirubin: 0.4 mg/dL (ref 0.2–1.2)

## 2015-05-30 LAB — POCT URINALYSIS DIP (DEVICE)
Bilirubin Urine: NEGATIVE
Glucose, UA: NEGATIVE mg/dL
HGB URINE DIPSTICK: NEGATIVE
Ketones, ur: NEGATIVE mg/dL
Leukocytes, UA: NEGATIVE
NITRITE: NEGATIVE
PH: 5.5 (ref 5.0–8.0)
PROTEIN: NEGATIVE mg/dL
SPECIFIC GRAVITY, URINE: 1.02 (ref 1.005–1.030)
UROBILINOGEN UA: 0.2 mg/dL (ref 0.0–1.0)

## 2015-05-30 NOTE — Progress Notes (Signed)
Subjective:  Jillian Hanson is a [age] y.o. G1P0000 at 4074w6d being seen today for Work In AstronomerAppointment for Fall which occurred yesterday.    She is currently monitored for the following issues for this high-risk pregnancy and has Hypertension in pregnancy, antepartum; Type 2 diabetes mellitus affecting pregnancy, antepartum; Supervision of high risk pregnancy, antepartum; Hypothyroid in pregnancy, antepartum; and Group B streptococcal bacteriuria on her problem list.  Patient reports Fall yesterday, fell to knees. Felt one sharp pain in side, went away.  Contractions: Not present. Vag. Bleeding: None.  Movement: Present. Denies leaking of fluid.   The following portions of the patient's history were reviewed and updated as appropriate: allergies, current medications, past family history, past medical history, past social history, past surgical history and problem list. Problem list updated.  Objective:   Filed Vitals:   05/30/15 0833  BP: 119/78  Pulse: 112  Weight: 250 lb 3.2 oz (113.49 kg)    Fetal Status: Fetal Heart Rate (bpm): 150   Movement: Present     General:  Alert, oriented and cooperative. Patient is in no acute distress.  Skin: Skin is warm and dry. No rash noted.   Cardiovascular: Normal heart rate noted  Respiratory: Normal respiratory effort, no problems with respiration noted  Abdomen: Soft, gravid, appropriate for gestational age. Pain/Pressure: Absent     Pelvic: Vag. Bleeding: None     Cervical exam deferred        Extremities: Normal range of motion.  Edema: None  Mental Status: Normal mood and affect. Normal behavior. Normal judgment and thought content.   Urinalysis:      Assessment and Plan:  Pregnancy: G1P0000 at 3674w6d  1. Fall, initial encounter     Good FHR     Discussed sharp pain was probably round ligaments     Discussed fetus pretty well protected     Did reiterate that in the future, past 24 weeks, if she falls, needs to be monitored  Preterm labor  symptoms and general obstetric precautions including but not limited to vaginal bleeding, contractions, leaking of fluid and fetal movement were reviewed in detail with the patient. Please refer to After Visit Summary for other counseling recommendations.   RTC as scheduled  Aviva SignsMarie L Leemon Ayala, CNM

## 2015-05-30 NOTE — Patient Instructions (Signed)
Fall Prevention in the Home  Falls can cause injuries and can affect people from all age groups. There are many simple things that you can do to make your home safe and to help prevent falls. WHAT CAN I DO ON THE OUTSIDE OF MY HOME?  Regularly repair the edges of walkways and driveways and fix any cracks.  Remove high doorway thresholds.  Trim any shrubbery on the main path into your home.  Use bright outdoor lighting.  Clear walkways of debris and clutter, including tools and rocks.  Regularly check that handrails are securely fastened and in good repair. Both sides of any steps should have handrails.  Install guardrails along the edges of any raised decks or porches.  Have leaves, snow, and ice cleared regularly.  Use sand or salt on walkways during winter months.  In the garage, clean up any spills right away, including grease or oil spills. WHAT CAN I DO IN THE BATHROOM?  Use night lights.  Install grab bars by the toilet and in the tub and shower. Do not use towel bars as grab bars.  Use non-skid mats or decals on the floor of the tub or shower.  If you need to sit down while you are in the shower, use a plastic, non-slip stool..  Keep the floor dry. Immediately clean up any water that spills on the floor.  Remove soap buildup in the tub or shower on a regular basis.  Attach bath mats securely with double-sided non-slip rug tape.  Remove throw rugs and other tripping hazards from the floor. WHAT CAN I DO IN THE BEDROOM?  Use night lights.  Make sure that a bedside light is easy to reach.  Do not use oversized bedding that drapes onto the floor.  Have a firm chair that has side arms to use for getting dressed.  Remove throw rugs and other tripping hazards from the floor. WHAT CAN I DO IN THE KITCHEN?   Clean up any spills right away.  Avoid walking on wet floors.  Place frequently used items in easy-to-reach places.  If you need to reach for something  above you, use a sturdy step stool that has a grab bar.  Keep electrical cables out of the way.  Do not use floor polish or wax that makes floors slippery. If you have to use wax, make sure that it is non-skid floor wax.  Remove throw rugs and other tripping hazards from the floor. WHAT CAN I DO IN THE STAIRWAYS?  Do not leave any items on the stairs.  Make sure that there are handrails on both sides of the stairs. Fix handrails that are broken or loose. Make sure that handrails are as long as the stairways.  Check any carpeting to make sure that it is firmly attached to the stairs. Fix any carpet that is loose or worn.  Avoid having throw rugs at the top or bottom of stairways, or secure the rugs with carpet tape to prevent them from moving.  Make sure that you have a light switch at the top of the stairs and the bottom of the stairs. If you do not have them, have them installed. WHAT ARE SOME OTHER FALL PREVENTION TIPS?  Wear closed-toe shoes that fit well and support your feet. Wear shoes that have rubber soles or low heels.  When you use a stepladder, make sure that it is completely opened and that the sides are firmly locked. Have someone hold the ladder while you   are using it. Do not climb a closed stepladder.  Add color or contrast paint or tape to grab bars and handrails in your home. Place contrasting color strips on the first and last steps.  Use mobility aids as needed, such as canes, walkers, scooters, and crutches.  Turn on lights if it is dark. Replace any light bulbs that burn out.  Set up furniture so that there are clear paths. Keep the furniture in the same spot.  Fix any uneven floor surfaces.  Choose a carpet design that does not hide the edge of steps of a stairway.  Be aware of any and all pets.  Review your medicines with your healthcare provider. Some medicines can cause dizziness or changes in blood pressure, which increase your risk of falling. Talk  with your health care provider about other ways that you can decrease your risk of falls. This may include working with a physical therapist or trainer to improve your strength, balance, and endurance.   This information is not intended to replace advice given to you by your health care provider. Make sure you discuss any questions you have with your health care provider.   Document Released: 01/04/2002 Document Revised: 05/31/2014 Document Reviewed: 02/18/2014 Elsevier Interactive Patient Education 2016 [employer]  

## 2015-05-30 NOTE — Progress Notes (Signed)
Patient fell yesterday while playing with her niece. Landed on her knees but after she got up fell sharp pain. No vaginal bleeding, pain better today.

## 2015-05-30 NOTE — Telephone Encounter (Signed)
Pt called and stated that she can not be seen by her dermatologist because they don't take her insurance can her cream be refilled.

## 2015-05-31 ENCOUNTER — Other Ambulatory Visit: Payer: Self-pay

## 2015-05-31 ENCOUNTER — Telehealth: Payer: Self-pay | Admitting: *Deleted

## 2015-05-31 DIAGNOSIS — O162 Unspecified maternal hypertension, second trimester: Secondary | ICD-10-CM

## 2015-05-31 DIAGNOSIS — G4700 Insomnia, unspecified: Secondary | ICD-10-CM

## 2015-05-31 LAB — PROTEIN, URINE, 24 HOUR
Protein, 24H Urine: 120 mg/24 h (ref <150)
Protein, Urine: 4 mg/dL — ABNORMAL LOW (ref 5–24)

## 2015-05-31 LAB — CREATININE CLEARANCE, URINE, 24 HOUR
CREAT CLEAR: 223 mL/min — AB (ref 75–115)
CREATININE 24H UR: 1.86 g/(24.h) (ref 0.63–2.50)
Creatinine, Urine: 62 mg/dL (ref 20–320)
Creatinine: 0.58 mg/dL (ref 0.50–1.10)

## 2015-05-31 MED ORDER — ZOLPIDEM TARTRATE 5 MG PO TABS: 5.0000 mg | ORAL_TABLET | Freq: Every evening | ORAL | Status: AC | PRN

## 2015-05-31 MED ORDER — TRIAMCINOLONE ACETONIDE 0.025 % EX OINT: 1.0000 "application " | TOPICAL_OINTMENT | Freq: Two times a day (BID) | CUTANEOUS | Status: AC

## 2015-05-31 NOTE — Telephone Encounter (Signed)
Per Harraway-Smith patient can have Triamcinolone called in her pharmacy.

## 2015-05-31 NOTE — Telephone Encounter (Signed)
Per message from Dr. Penne LashLeggett- patient needs follow up ultrasound in 4 weeks from last ultrasound.  Called and scheduled for 06/19/15 0845. Called Martasia and notified her of ultrasound appt and Reviewed her next ob appt with her. She voiced understanding.

## 2015-05-31 NOTE — Telephone Encounter (Signed)
Jillian Hanson called and left a message today that she is [redacted] weeks pregnant and her sister died and she is not sleeping well. States her heart rate is 123. Wants to know if she should go to ER.  Called [name] and support given. She reports she feels tired , but can't sleep. States she fell asleep cooking and burned herself- but was a minor burn. [place].  Discussed with her pulse is only a little higher than her normal. Discussed with Dr.Constant and advised patient that when people grieve often do not take care of self- encouraged her to make sure she is  At least drinking water often , even if not eating much.She states she has not drinking much at all.  Also [place]. Instructed her to only take ambien when needed- not every night so she doesn't become Dependent on it. She voices understanding.

## 2015-06-05 ENCOUNTER — Ambulatory Visit: Payer: Medicaid Other

## 2015-06-19 ENCOUNTER — Other Ambulatory Visit: Payer: Self-pay | Admitting: Obstetrics & Gynecology

## 2015-06-19 ENCOUNTER — Ambulatory Visit (HOSPITAL_COMMUNITY)
Admission: RE | Admit: 2015-06-19 | Discharge: 2015-06-19 | Disposition: A | Discharge status: Home / Self Care | Payer: Medicaid Other | Source: Ambulatory Visit | Attending: Obstetrics & Gynecology | Admitting: Obstetrics & Gynecology

## 2015-06-19 ENCOUNTER — Ambulatory Visit: Payer: Medicaid Other | Admitting: Obstetrics & Gynecology

## 2015-06-19 DIAGNOSIS — O10012 Pre-existing essential hypertension complicating pregnancy, second trimester: Secondary | ICD-10-CM | POA: Insufficient documentation

## 2015-06-19 DIAGNOSIS — O99282 Endocrine, nutritional and metabolic diseases complicating pregnancy, second trimester: Secondary | ICD-10-CM | POA: Diagnosis not present

## 2015-06-19 DIAGNOSIS — O162 Unspecified maternal hypertension, second trimester: Secondary | ICD-10-CM

## 2015-06-19 DIAGNOSIS — O99212 Obesity complicating pregnancy, second trimester: Secondary | ICD-10-CM

## 2015-06-19 DIAGNOSIS — E039 Hypothyroidism, unspecified: Secondary | ICD-10-CM

## 2015-06-19 DIAGNOSIS — Z3A23 23 weeks gestation of pregnancy: Secondary | ICD-10-CM | POA: Diagnosis not present

## 2015-06-19 DIAGNOSIS — O24312 Unspecified pre-existing diabetes mellitus in pregnancy, second trimester: Secondary | ICD-10-CM

## 2015-06-19 DIAGNOSIS — O24119 Pre-existing diabetes mellitus, type 2, in pregnancy, unspecified trimester: Secondary | ICD-10-CM

## 2015-06-19 DIAGNOSIS — O0992 Supervision of high risk pregnancy, unspecified, second trimester: Secondary | ICD-10-CM

## 2015-06-19 NOTE — Progress Notes (Signed)
Pt informed me that she has an OB appt scheduled with her new OB 06/20/15 @ 0930 @ [facility].  Pt missed her OB appt @ Clinics today.  Pt stated that she is fine with not being seen by provider today and will f/u with her new ob appt scheduled for tomorrow.

## 2015-06-20 NOTE — Progress Notes (Signed)
Pt left AMA before appointment

## 2015-12-24 ENCOUNTER — Encounter (HOSPITAL_COMMUNITY): Payer: Self-pay

## 2016-02-27 ENCOUNTER — Emergency Department (HOSPITAL_COMMUNITY)
Admission: EM | Admit: 2016-02-27 | Discharge: 2016-02-28 | Disposition: A | Discharge status: Home / Self Care | Payer: Medicaid Other | Attending: Emergency Medicine | Admitting: Emergency Medicine

## 2016-02-27 ENCOUNTER — Encounter (HOSPITAL_COMMUNITY): Payer: Self-pay | Admitting: Emergency Medicine

## 2016-02-27 DIAGNOSIS — Z91010 Allergy to peanuts: Secondary | ICD-10-CM | POA: Insufficient documentation

## 2016-02-27 DIAGNOSIS — I10 Essential (primary) hypertension: Secondary | ICD-10-CM | POA: Insufficient documentation

## 2016-02-27 DIAGNOSIS — Z794 Long term (current) use of insulin: Secondary | ICD-10-CM | POA: Diagnosis not present

## 2016-02-27 DIAGNOSIS — Z7982 Long term (current) use of aspirin: Secondary | ICD-10-CM | POA: Insufficient documentation

## 2016-02-27 DIAGNOSIS — H6991 Unspecified Eustachian tube disorder, right ear: Secondary | ICD-10-CM

## 2016-02-27 DIAGNOSIS — M549 Dorsalgia, unspecified: Secondary | ICD-10-CM

## 2016-02-27 DIAGNOSIS — E119 Type 2 diabetes mellitus without complications: Secondary | ICD-10-CM | POA: Insufficient documentation

## 2016-02-27 DIAGNOSIS — J029 Acute pharyngitis, unspecified: Secondary | ICD-10-CM | POA: Diagnosis present

## 2016-02-27 DIAGNOSIS — E039 Hypothyroidism, unspecified: Secondary | ICD-10-CM | POA: Insufficient documentation

## 2016-02-27 DIAGNOSIS — M546 Pain in thoracic spine: Secondary | ICD-10-CM | POA: Diagnosis not present

## 2016-02-27 DIAGNOSIS — H6981 Other specified disorders of Eustachian tube, right ear: Secondary | ICD-10-CM

## 2016-02-27 NOTE — ED Triage Notes (Signed)
Pt states the right side of her throat hurts  Pt states she thought it was due to her thyroid and called her dr and was told to continue her medication and her next appt was on Friday  Pt states today she started having pain in her upper back   Pt states the pain in her back hurts worse when she swallows

## 2016-02-28 MED ORDER — ACETAMINOPHEN 500 MG PO TABS
1000.0000 mg | ORAL_TABLET | Freq: Once | ORAL | Status: AC
  Administered 2016-02-28: 1000 mg via ORAL
  Filled 2016-02-28: qty 2

## 2016-02-28 NOTE — Discharge Instructions (Signed)
TAKE TYLENOL FOR YOUR PAIN   Contact a health care provider if: Your symptoms do not go away after treatment. Your symptoms come back after treatment. You are unable to pop your ears. You have: A fever. Pain in your ear. Pain in your head or neck. Fluid draining from your ear. Your hearing suddenly changes. You become very dizzy. You lose your balance.  SEEK IMMEDIATE MEDICAL ATTENTION IF: New numbness, tingling, weakness, or problem with the use of your arms or legs.  Severe back pain not relieved with medications.  Change in bowel or bladder control.  Increasing pain in any areas of the body (such as chest or abdominal pain).  Shortness of breath, dizziness or fainting.  Nausea (feeling sick to your stomach), vomiting, fever, or sweats.

## 2016-02-28 NOTE — ED Provider Notes (Signed)
[facility]-EMERGENCY DEPT Provider Note   CSN: [id number] Arrival date & time: 02/27/16  2149  By signing my name below, I, Orpah Cobb, attest that this documentation has been prepared under the direction and in the presence of Arthor Captain, PA-C. Electronically Signed: Lyndon Code., ED Scribe. 02/28/16. 12:54 AM.   History   Chief Complaint Chief Complaint  Patient presents with  . Back Pain  . Sore Throat    HPI  Jillian Hanson is a 31 y.o. female with hx of hypothyroidism who presents to the Emergency Department complaining of mild upper back pain with sudden onset x1 day. Pt states that she awoke this morning with upper back pain. She states that the R side of her throat is also swollen and has been painful for the past x3 days. She reports the sore throat is exacerbated with swallowing. She states that that the back pain is exacerbated by swallowing and movement. She reports ear pain. Pt denies taking anything for the pain. She denies fever.    The history is provided by the patient. No language interpreter was used.    Past Medical History:  Diagnosis Date  . Diabetes mellitus without complication (HCC)   . Hypertension   . Hypothyroidism     Patient Active Problem List   Diagnosis Date Noted  . Group B streptococcal bacteriuria 05/22/2015  . Hypertension in pregnancy, antepartum 03/13/2015  . Type 2 diabetes mellitus affecting pregnancy, antepartum 03/13/2015  . Supervision of high risk pregnancy, antepartum 03/13/2015  . Hypothyroid in pregnancy, antepartum 03/13/2015    Past Surgical History:  Procedure Laterality Date  . CESAREAN SECTION    . NO PAST SURGERIES      OB History    Gravida Para Term Preterm AB Living   2 0 0 0 0 0   SAB TAB Ectopic Multiple Live Births   0 0 0 0         Home Medications    Prior to Admission medications   Medication Sig Start Date End Date Taking? Authorizing Provider  aspirin EC 81 MG tablet Take 1 tablet  (81 mg total) by mouth daily. Start at 12 weeks 03/13/15   Lesly Dukes, MD  cephALEXin (KEFLEX) 500 MG capsule Take 500 mg by mouth 3 (three) times daily. Reported on 05/30/2015    Historical Provider, MD  insulin NPH Human (HUMULIN N,NOVOLIN N) 100 UNIT/ML injection NPH ReliOn Insulin administer 8 units before breakfast and 9 units in the evening for T2DM complicated by pregnancy O24.119 Patient taking differently: Inject 8-10 Units into the skin 2 (two) times daily before a meal. NPH ReliOn Insulin administer 8 units before breakfast and 10 units in the evening for T2DM complicated by pregnancy O24.119 03/13/15   Lesly Dukes, MD  Insulin Syringe-Needle U-100 (INSULIN SYRINGE 1CC/31GX5/16") 31G X 5/16" 1 ML MISC 1 each by Does not apply route 2 (two) times daily. DX T2DM complicated by pregnancy O24.119 for inject 2 times daily 02/27/15   Federico Flake, MD  labetalol (NORMODYNE) 100 MG tablet TAKE ONE TABLET BY MOUTH TWICE DAILY 05/29/15   Dorathy Kinsman, CNM  levothyroxine (SYNTHROID) 100 MCG tablet Take 1 tablet (100 mcg total) by mouth daily before breakfast. 03/13/15   Lesly Dukes, MD  metFORMIN (GLUCOPHAGE) 500 MG tablet Take 1 tablet (500 mg total) by mouth 2 (two) times daily with a meal. 02/27/15   Federico Flake, MD  Prenatal Vit-Fe Fumarate-FA (PRENATAL MULTIVITAMIN) TABS tablet Take 1  tablet by mouth daily at 12 noon.    Historical Provider, MD  triamcinolone (KENALOG) 0.025 % ointment Apply 1 application topically 2 (two) times daily. 05/31/15   Willodean Rosenthalarolyn Harraway-Smith, MD  zolpidem (AMBIEN) 5 MG tablet Take 1 tablet (5 mg total) by mouth at bedtime as needed for sleep. 05/31/15   Catalina AntiguaPeggy Constant, MD    Family History Family History  Problem Relation Age of Onset  . Diabetes Mother   . Heart disease Father   . Asthma Brother     Social History Social History  Substance Use Topics  . Smoking status: Never Smoker  . Smokeless tobacco: Never Used  . Alcohol use No      Allergies   Peanut-containing drug products   Review of Systems Review of Systems  Constitutional: Negative for fever.  HENT: Positive for ear pain and sore throat.   Musculoskeletal: Positive for back pain.    A complete 10 system review of systems was obtained and all systems are negative except as noted in the HPI and PMH.    Physical Exam Updated Vital Signs BP 133/91 (BP Location: Left Arm)   Pulse 120   Temp 98.3 F (36.8 C) (Oral)   Resp 18   Ht 5\' 1"  (1.549 m)   Wt 255 lb 1 oz (115.7 kg)   LMP 02/11/2016 (Approximate)   SpO2 100%   BMI 48.19 kg/m   Physical Exam  Constitutional: She appears well-developed and well-nourished. No distress.  HENT:  Head: Normocephalic and atraumatic.  R ear with effusion   Eyes: Conjunctivae are normal.  Neck: Neck supple.  Cardiovascular: Normal rate and regular rhythm.   No murmur heard. Pulmonary/Chest: Effort normal and breath sounds normal. No respiratory distress.  Abdominal: Soft. There is no tenderness.  Musculoskeletal: She exhibits no edema.  Normal strength in the upper extremities. Trapezius muscles are extremely tight. Grip strength is equal bilataterally, normal radial pulses. Negative Phalen's sign and negative Tinel's sign.   Neurological: She is alert. She has normal strength.  Normal strength in the upper extremities.   Skin: Skin is warm and dry.  Psychiatric: She has a normal mood and affect.  Nursing note and vitals reviewed.    ED Treatments / Results   DIAGNOSTIC STUDIES: Oxygen Saturation is 100% on RA, normal by my interpretation.   COORDINATION OF CARE: 12:54 AM-Discussed next steps with pt. Pt verbalized understanding and is agreeable with the plan.    Labs (all labs ordered are listed, but only abnormal results are displayed) Labs Reviewed - No data to display  EKG  EKG Interpretation None       Radiology No results found.  Procedures Procedures (including critical  care time)  Medications Ordered in ED Medications - No data to display   Initial Impression / Assessment and Plan / ED Course  I have reviewed the triage vital signs and the nursing notes.  Pertinent labs & imaging results that were available during my care of the patient were reviewed by me and considered in my medical decision making (see chart for details).     Patient back pain appears to be Musculoskeletal and likely related to her large breasts. No weakness or concern for neurologic abnormality. R ear with effusion. I suspect eustachian tube dysfunction as the cause of her pain. Tolerating her own saliva, no tonsillar lymphadenopathy. Minimal pharyngeal erythema without exudate. Patient is early in pregnancy and will be discharged with Tylenol. She appears safe for discharge at this time  Final Clinical Impressions(s) / ED Diagnoses   Final diagnoses:  Mid back pain  Eustachian tube dysfunction, right    New Prescriptions New Prescriptions   No medications on file   I personally performed the services described in this documentation, which was scribed in my presence. The recorded information has been reviewed and is accurate.       Arthor Captain, PA-C 02/28/16 1610    Shon Baton, MD 02/28/16 [phone]

## 2016-03-05 ENCOUNTER — Other Ambulatory Visit: Payer: Self-pay | Admitting: Family Medicine

## 2016-03-05 DIAGNOSIS — M549 Dorsalgia, unspecified: Secondary | ICD-10-CM

## 2016-03-05 DIAGNOSIS — O10911 Unspecified pre-existing hypertension complicating pregnancy, first trimester: Secondary | ICD-10-CM

## 2016-03-05 DIAGNOSIS — O24111 Pre-existing diabetes mellitus, type 2, in pregnancy, first trimester: Secondary | ICD-10-CM

## 2016-03-05 DIAGNOSIS — O9989 Other specified diseases and conditions complicating pregnancy, childbirth and the puerperium: Secondary | ICD-10-CM

## 2016-03-05 DIAGNOSIS — O368390 Maternal care for abnormalities of the fetal heart rate or rhythm, unspecified trimester, not applicable or unspecified: Secondary | ICD-10-CM

## 2016-06-20 ENCOUNTER — Emergency Department (HOSPITAL_COMMUNITY): Payer: Medicaid Other

## 2016-06-20 ENCOUNTER — Emergency Department (HOSPITAL_COMMUNITY)
Admission: EM | Admit: 2016-06-20 | Discharge: 2016-06-21 | Disposition: A | Discharge status: Home / Self Care | Payer: Medicaid Other | Attending: Emergency Medicine | Admitting: Emergency Medicine

## 2016-06-20 ENCOUNTER — Encounter (HOSPITAL_COMMUNITY): Payer: Self-pay | Admitting: Emergency Medicine

## 2016-06-20 DIAGNOSIS — E119 Type 2 diabetes mellitus without complications: Secondary | ICD-10-CM | POA: Diagnosis not present

## 2016-06-20 DIAGNOSIS — Z79899 Other long term (current) drug therapy: Secondary | ICD-10-CM | POA: Insufficient documentation

## 2016-06-20 DIAGNOSIS — K828 Other specified diseases of gallbladder: Secondary | ICD-10-CM

## 2016-06-20 DIAGNOSIS — R1011 Right upper quadrant pain: Secondary | ICD-10-CM | POA: Diagnosis not present

## 2016-06-20 DIAGNOSIS — Z7984 Long term (current) use of oral hypoglycemic drugs: Secondary | ICD-10-CM | POA: Diagnosis not present

## 2016-06-20 DIAGNOSIS — K838 Other specified diseases of biliary tract: Secondary | ICD-10-CM | POA: Diagnosis not present

## 2016-06-20 DIAGNOSIS — I10 Essential (primary) hypertension: Secondary | ICD-10-CM | POA: Insufficient documentation

## 2016-06-20 LAB — LIPASE, BLOOD: Lipase: 27 U/L (ref 11–51)

## 2016-06-20 LAB — URINALYSIS, ROUTINE W REFLEX MICROSCOPIC
BILIRUBIN URINE: NEGATIVE
Glucose, UA: NEGATIVE mg/dL
KETONES UR: 5 mg/dL — AB
Nitrite: NEGATIVE
Protein, ur: 30 mg/dL — AB
Specific Gravity, Urine: 1.031 — ABNORMAL HIGH (ref 1.005–1.030)
pH: 6 (ref 5.0–8.0)

## 2016-06-20 LAB — COMPREHENSIVE METABOLIC PANEL
ALBUMIN: 3.5 g/dL (ref 3.5–5.0)
ALT: 27 U/L (ref 14–54)
ANION GAP: 10 (ref 5–15)
AST: 72 U/L — ABNORMAL HIGH (ref 15–41)
Alkaline Phosphatase: 87 U/L (ref 38–126)
BUN: 8 mg/dL (ref 6–20)
CHLORIDE: 101 mmol/L (ref 101–111)
CO2: 29 mmol/L (ref 22–32)
CREATININE: 0.62 mg/dL (ref 0.44–1.00)
Calcium: 9.2 mg/dL (ref 8.9–10.3)
GFR calc non Af Amer: >60 mL/min (ref >60)
GLUCOSE: 171 mg/dL — AB (ref 65–99)
Potassium: 3.7 mmol/L (ref 3.5–5.1)
SODIUM: 140 mmol/L (ref 135–145)
Total Bilirubin: 0.5 mg/dL (ref 0.3–1.2)
Total Protein: 8.5 g/dL — ABNORMAL HIGH (ref 6.5–8.1)

## 2016-06-20 LAB — I-STAT BETA HCG BLOOD, ED (MC, WL, AP ONLY)

## 2016-06-20 LAB — CBC
HCT: 36.5 % (ref 36.0–46.0)
HEMOGLOBIN: 12 g/dL (ref 12.0–15.0)
MCH: 24 pg — AB (ref 26.0–34.0)
MCHC: 32.9 g/dL (ref 30.0–36.0)
MCV: 73.1 fL — AB (ref 78.0–100.0)
Platelets: 433 10*3/uL — ABNORMAL HIGH (ref 150–400)
RBC: 4.99 MIL/uL (ref 3.87–5.11)
RDW: 14.6 % (ref 11.5–15.5)
WBC: 12.3 10*3/uL — ABNORMAL HIGH (ref 4.0–10.5)

## 2016-06-20 MED ORDER — ONDANSETRON HCL 4 MG/2ML IJ SOLN: 4.0000 mg | Freq: Once | INTRAMUSCULAR | Status: DC | PRN

## 2016-06-20 MED ORDER — HYDROCODONE-ACETAMINOPHEN 5-325 MG PO TABS: 1.0000 | ORAL_TABLET | Freq: Four times a day (QID) | ORAL | 0 refills | Status: AC | PRN

## 2016-06-20 MED ORDER — ONDANSETRON HCL 4 MG PO TABS: 4.0000 mg | ORAL_TABLET | Freq: Four times a day (QID) | ORAL | 0 refills | Status: AC

## 2016-06-20 MED ORDER — MORPHINE SULFATE (PF) 2 MG/ML IV SOLN: 4.0000 mg | INTRAVENOUS | Status: DC | PRN

## 2016-06-20 NOTE — ED Notes (Signed)
Pt states she has a history of gallstones and these symptoms feel the same.

## 2016-06-20 NOTE — ED Notes (Signed)
Pt reports pain has since resolved.

## 2016-06-20 NOTE — ED Triage Notes (Signed)
Pt comes in with complaints of RUQ abdominal pain that started about 20 minutes ago. Pt reports nausea without vomiting. Denies diarrhea.  Pt tearful upon exam. A&O x4.  Ambulatory.

## 2016-06-20 NOTE — ED Notes (Signed)
Pt aware a urine sample is needed but is unable to provide one at this time. 

## 2016-06-20 NOTE — ED Provider Notes (Signed)
[facility]-EMERGENCY DEPT Provider Note   CSN: [id number] Arrival date & time: 06/20/16  2035   By signing my name below, I, Clarisse GougeXavier Herndon, attest that this documentation has been prepared under the direction and in the presence of Roxy Horsemanobert Renleigh Ouellet, PA-C. Electronically Signed: Clarisse GougeXavier Herndon, Scribe. 06/20/16. 10:37 PM.   History   Chief Complaint Chief Complaint  Patient presents with  . Abdominal Pain   The history is provided by the patient and medical records. No language interpreter was used.    Jillian Hanson is a 31 y.o. female h/o gallstones who presents to the Emergency Department with concern for severe, fluctuating RUQ abdominal pain. Pain has subsided just prior to evaluation. Associated nausea noted. H/o C-section noted ~09/2016. She states she last ate chips and dried fruit today. Pt states she took aspirin PTA without relief. No fevers, chills, dysuria or vomiting noted.  Past Medical History:  Diagnosis Date  . Diabetes mellitus without complication (HCC)   . Hypertension   . Hypothyroidism     Patient Active Problem List   Diagnosis Date Noted  . Group B streptococcal bacteriuria 05/22/2015  . Hypertension in pregnancy, antepartum 03/13/2015  . Type 2 diabetes mellitus affecting pregnancy, antepartum 03/13/2015  . Supervision of high risk pregnancy, antepartum 03/13/2015  . Hypothyroid in pregnancy, antepartum 03/13/2015    Past Surgical History:  Procedure Laterality Date  . CESAREAN SECTION    . NO PAST SURGERIES      OB History    Gravida Para Term Preterm AB Living   2 0 0 0 0 0   SAB TAB Ectopic Multiple Live Births   0 0 0 0         Home Medications    Prior to Admission medications   Medication Sig Start Date End Date Taking? Authorizing Provider  levothyroxine (SYNTHROID) 100 MCG tablet Take 1 tablet (100 mcg total) by mouth daily before breakfast. 03/13/15  Yes Lesly DukesLeggett, Kelly H, MD  lisinopril (PRINIVIL,ZESTRIL) 5 MG tablet Take 5 mg by mouth 2  (two) times daily.   Yes [provider]  metFORMIN (GLUCOPHAGE) 500 MG tablet Take 1 tablet (500 mg total) by mouth 2 (two) times daily with a meal. 02/27/15  Yes Federico FlakeNewton, Kimberly Niles, MD  aspirin EC 81 MG tablet Take 1 tablet (81 mg total) by mouth daily. Start at 12 weeks Patient not taking: Reported on 06/20/2016 03/13/15   Lesly DukesLeggett, Kelly H, MD  insulin NPH Human (HUMULIN N,NOVOLIN N) 100 UNIT/ML injection NPH ReliOn Insulin administer 8 units before breakfast and 9 units in the evening for T2DM complicated by pregnancy O24.119 Patient not taking: Reported on 06/20/2016 03/13/15   Lesly DukesLeggett, Kelly H, MD  Insulin Syringe-Needle U-100 (INSULIN SYRINGE 1CC/31GX5/16") 31G X 5/16" 1 ML MISC 1 each by Does not apply route 2 (two) times daily. DX T2DM complicated by pregnancy O24.119 for inject 2 times daily 02/27/15   Federico FlakeNewton, Kimberly Niles, MD  labetalol (NORMODYNE) 100 MG tablet TAKE ONE TABLET BY MOUTH TWICE DAILY Patient not taking: Reported on 06/20/2016 05/29/15   Katrinka BlazingSmith, [place], CNM  triamcinolone (KENALOG) 0.025 % ointment Apply 1 application topically 2 (two) times daily. Patient not taking: Reported on 06/20/2016 05/31/15   Willodean RosenthalHarraway-Smith, Carolyn, MD  zolpidem (AMBIEN) 5 MG tablet Take 1 tablet (5 mg total) by mouth at bedtime as needed for sleep. Patient not taking: Reported on 06/20/2016 05/31/15   Constant, Peggy, MD    Family History Family History  Problem Relation Age of Onset  .  Diabetes Mother   . Heart disease Father   . Asthma Brother     Social History Social History  Substance Use Topics  . Smoking status: Never Smoker  . Smokeless tobacco: Never Used  . Alcohol use No     Allergies   Peanut-containing drug products   Review of Systems Review of Systems  Constitutional: Negative for chills and fever.  Gastrointestinal: Positive for abdominal pain and nausea. Negative for vomiting.  Genitourinary: Negative for dysuria.  All other systems reviewed and are  negative.    Physical Exam Updated Vital Signs BP (!) 159/91 (BP Location: Left Arm)   Pulse (!) 107   Temp 98.7 F (37.1 C) (Oral)   Resp 20   Ht 5\' 1"  (1.549 m)   Wt 236 lb (107 kg)   LMP 02/11/2016 (Approximate)   SpO2 100%   Breastfeeding? Unknown   BMI 44.59 kg/m   Physical Exam  Constitutional: She is oriented to person, place, and time. She appears well-developed and well-nourished. No distress.  HENT:  Head: Normocephalic and atraumatic.  Eyes: EOM are normal.  Neck: Normal range of motion.  Cardiovascular: Normal rate, regular rhythm and normal heart sounds.   Pulmonary/Chest: Effort normal and breath sounds normal.  Abdominal: Soft. She exhibits no distension. There is tenderness.  Moderate RUQ tenderness. No murphy's sign. No other focal abdominal tenderness.  Musculoskeletal: Normal range of motion.  Neurological: She is alert and oriented to person, place, and time.  Skin: Skin is warm and dry.  Psychiatric: She has a normal mood and affect. Judgment normal.  Nursing note and vitals reviewed.    ED Treatments / Results  DIAGNOSTIC STUDIES: Oxygen Saturation is 100% on RA, NL by my interpretation.    COORDINATION OF CARE: 10:22 PM-Discussed next steps with pt. Pt verbalized understanding and is agreeable with the plan. Medication ordered. Will order labs.   Labs (all labs ordered are listed, but only abnormal results are displayed) Labs Reviewed  COMPREHENSIVE METABOLIC PANEL - Abnormal; Notable for the following:       Result Value   Glucose, Bld 171 (*)    Total Protein 8.5 (*)    AST 72 (*)    All other components within normal limits  CBC - Abnormal; Notable for the following:    WBC 12.3 (*)    MCV 73.1 (*)    MCH 24.0 (*)    Platelets 433 (*)    All other components within normal limits  URINALYSIS, ROUTINE W REFLEX MICROSCOPIC - Abnormal; Notable for the following:    Color, Urine AMBER (*)    APPearance CLOUDY (*)    Specific Gravity,  Urine 1.031 (*)    Hgb urine dipstick MODERATE (*)    Ketones, ur 5 (*)    Protein, ur 30 (*)    Leukocytes, UA SMALL (*)    Bacteria, UA RARE (*)    Squamous Epithelial / LPF TOO NUMEROUS TO COUNT (*)    All other components within normal limits  LIPASE, BLOOD  I-STAT BETA HCG BLOOD, ED ([facility], [facility], [facility] ONLY)    EKG  EKG Interpretation None       Radiology No results found.  Procedures Procedures (including critical care time)  Medications Ordered in ED Medications - No data to display   Initial Impression / Assessment and Plan / ED Course  I have reviewed the triage vital signs and the nursing notes.  Pertinent labs & imaging results that were available during  my care of the patient were reviewed by me and considered in my medical decision making (see chart for details).     Patient with right upper quadrant pain. Pain resolved prior to my evaluation. She has history of gallstones. Will check ultrasound for further evaluation. AST is mildly elevated.  Ultrasound shows gallbladder sludge. No evidence of cholecystitis. Recommend outpatient follow-up with general surgery. Patient is not vomiting. Her pain is well controlled. She is stable and ready for discharge.  Final Clinical Impressions(s) / ED Diagnoses   Final diagnoses:  RUQ pain  Gallbladder sludge    New Prescriptions New Prescriptions   HYDROCODONE-ACETAMINOPHEN (NORCO/VICODIN) 5-325 MG TABLET    Take 1-2 tablets by mouth every 6 (six) hours as needed.   ONDANSETRON (ZOFRAN) 4 MG TABLET    Take 1 tablet (4 mg total) by mouth every 6 (six) hours.   I personally performed the services described in this documentation, which was scribed in my presence. The recorded information has been reviewed and is accurate.      Roxy Horseman, PA-C 06/20/16 [id number]    Little, Ambrose [place], MD 06/25/16 [phone]

## 2016-06-20 NOTE — Discharge Instructions (Signed)
You need to follow-up with your doctor and with a general surgeon.  Please return if your symptoms worsen.

## 2018-02-26 IMAGING — US US MFM OB COMPLETE +14 WKS
1 series · 13 of 28 positions shown · non-contrast
Comparison: none

[Series 1: us mfm ob complete +14 wks · 0.16mm/px · 13 of 47 slices shown]
[im 2/47]
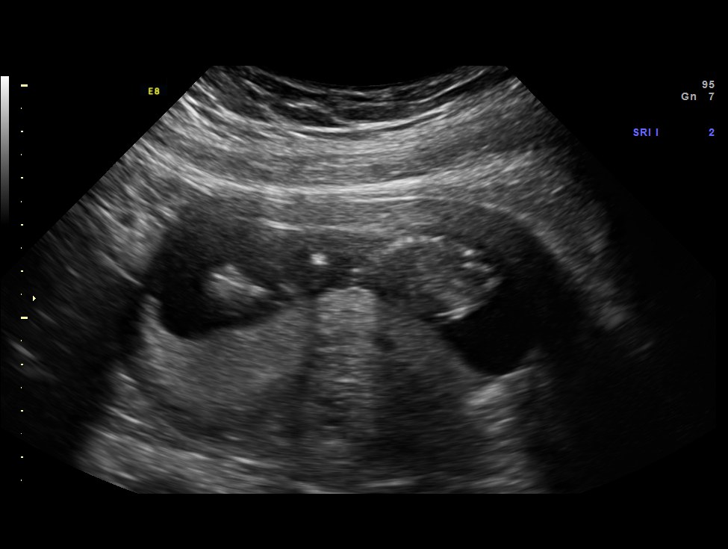
[im 6/47]
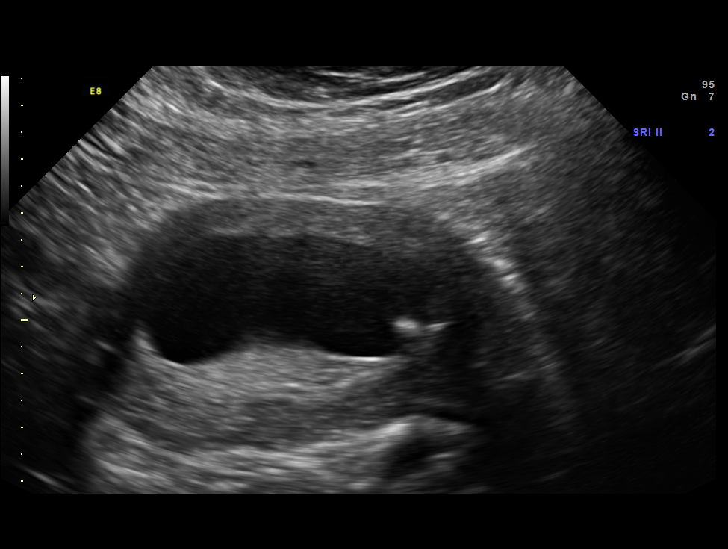
[im 9/47]
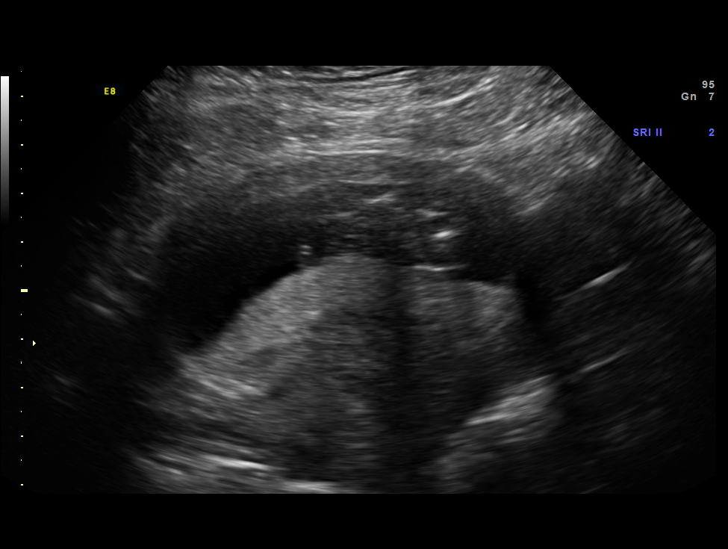
[im 12/47]
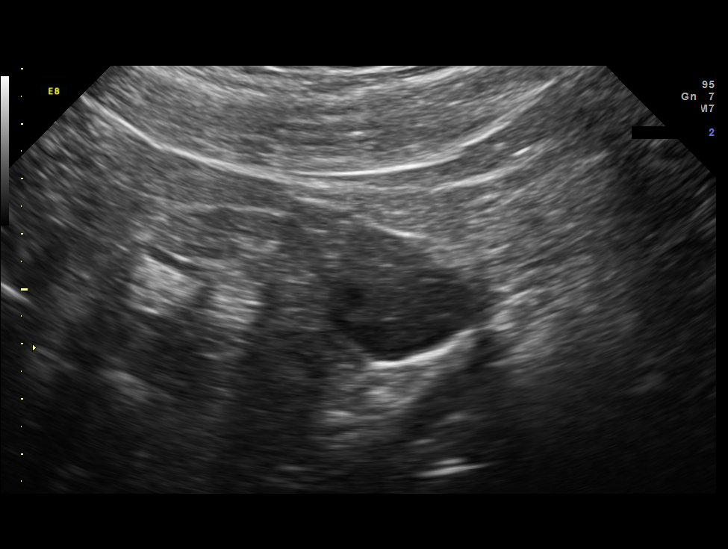
[im 16/47]
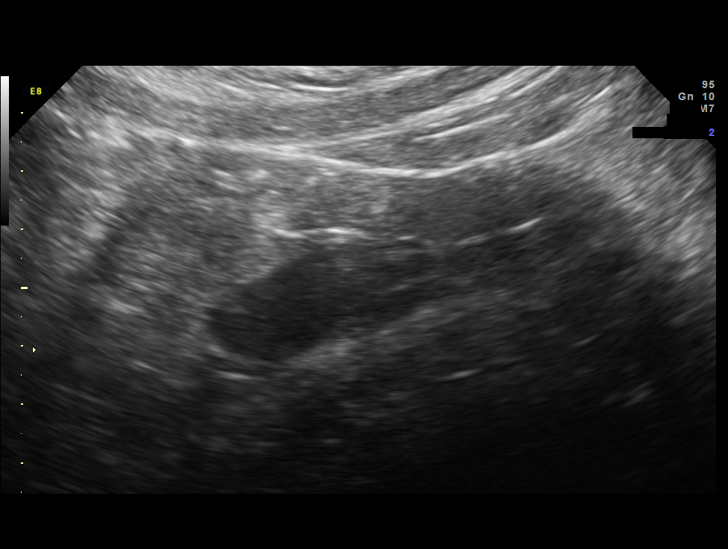
[im 19/47]
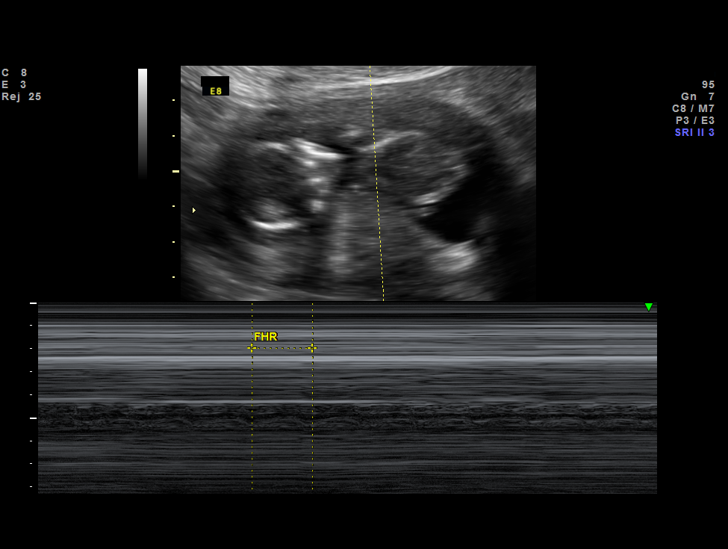
[im 24/47]
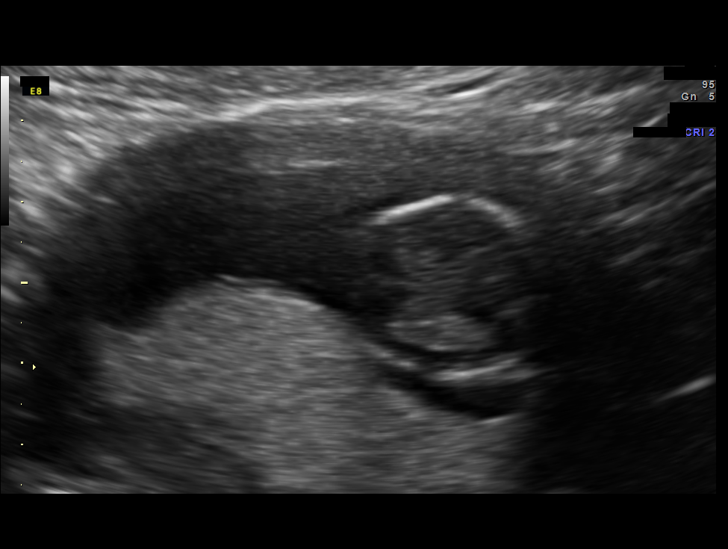
[im 28/47]
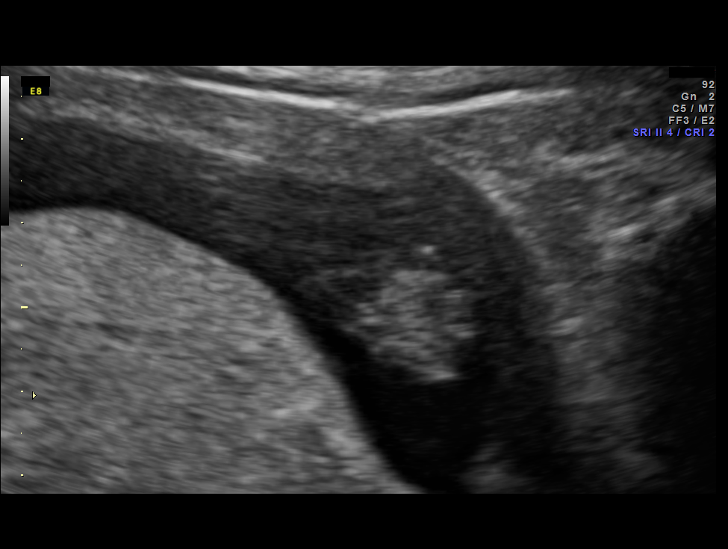
[im 31/47]
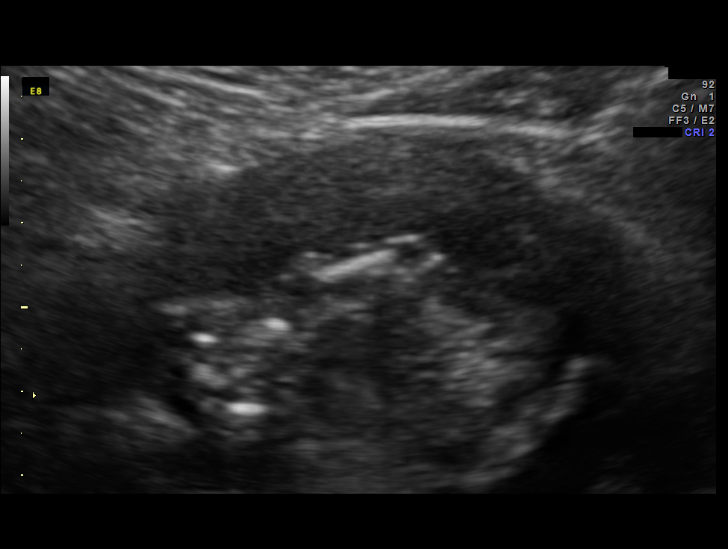
[im 35/47]
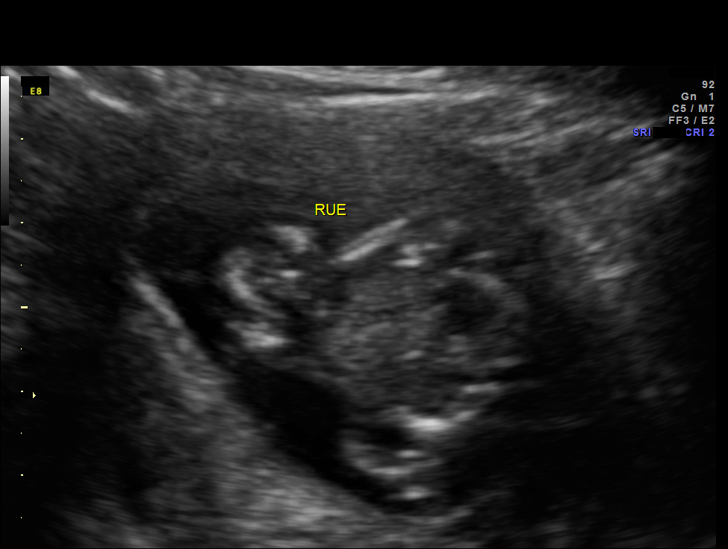
[im 38/47]
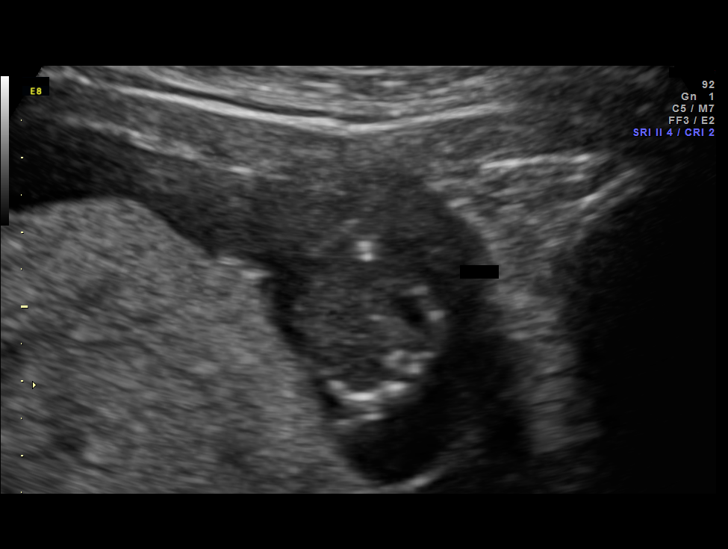
[im 41/47]
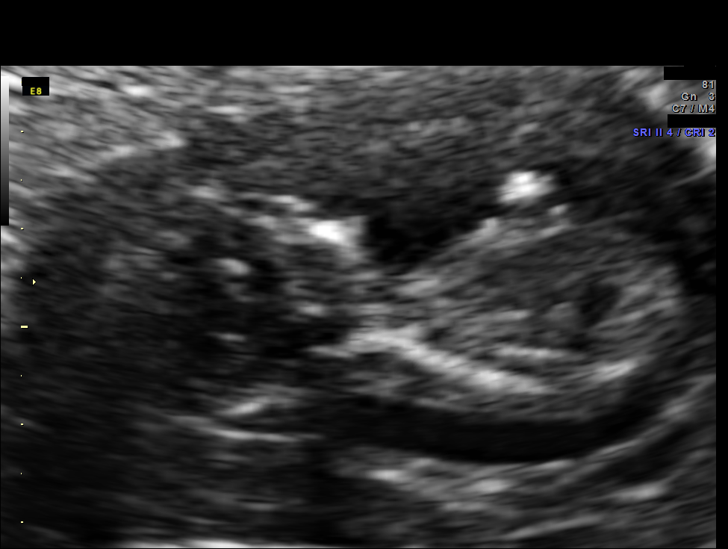
[im 45/47]
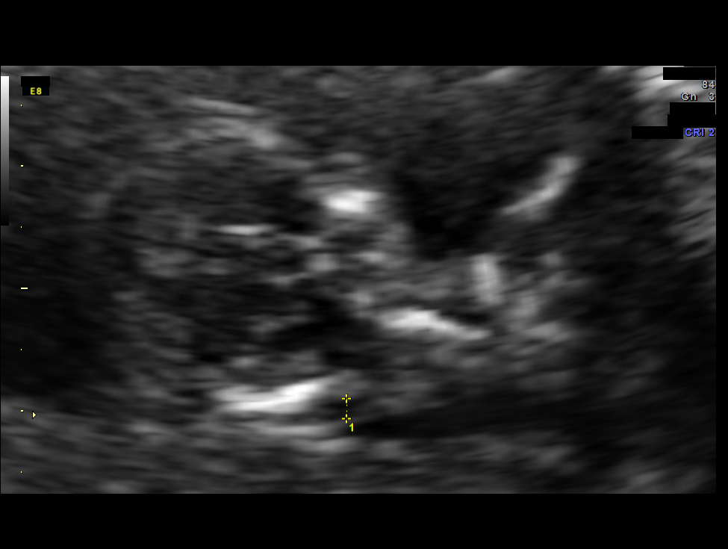

[13 of 28 positions shown; findings below may reference images not displayed]

[facility]
[REDACTED]-
Faculty Physician

WEEKS

1  CENCIONI LABARILE            [id number]      [id number]     [id number]
Indications

First trimester aneuploidy screen (NT)         Z36
Hypertension - Chronic/Pre-existing on
labetalol
Diabetes - Pregestational, 1st trimester on
metformin and insulin
Hypothyroid on Synthroid
Obesity complicating pregnancy, first
trimester
12 weeks gestation of pregnancy
OB History

Gravidity:    1
Fetal Evaluation

Num Of Fetuses:     1
Fetal Heart         167
Rate(bpm):
Cardiac Activity:   Observed
Presentation:       Breech
Placenta:           Posterior

Amniotic Fluid
AFI FV:      Subjectively within normal limits
Biometry
CRL:      69.4  mm     G. Age:  13w 0d                  EDD:   10/09/15
Gestational Age

Best:          12w 5d    Det. By:   Early Ultrasound         EDD:   10/11/15
(02/18/15)
Anatomy

Cranium:          Appears normal         Kidneys:          Appear normal
Choroid Plexus:   Appears normal         Bladder:          Appears normal
Stomach:          Appears normal, left   Upper             Appears normal
sided                  Extremities:
Abdominal Wall:   Appears nml (cord      Lower             Appears normal
insert, abd wall)      Extremities:

Other:  Technically difficult due to maternal habitus. Technically difficult due
to early gestational age.
Cervix Uterus Adnexa

Left Ovary
Within normal limits.

Right Ovary
Within normal limits.
Impression

SIUP at 12+5 weeks
No gross abnormalities identified
Unable to obtain the NT measurement today
Normal amniotic fluid volume
Measurements consistent with early US
Recommendations

Follow-up ultrasound in one  week to reassess NT
measurement

## 2018-04-16 IMAGING — US US MFM OB DETAIL+14 WK
1 series · 14 of 28 positions shown · non-contrast
Comparison: none

[Series 1: us mfm ob detail+14 wk · 14 of 86 slices shown]
[im 4/86]
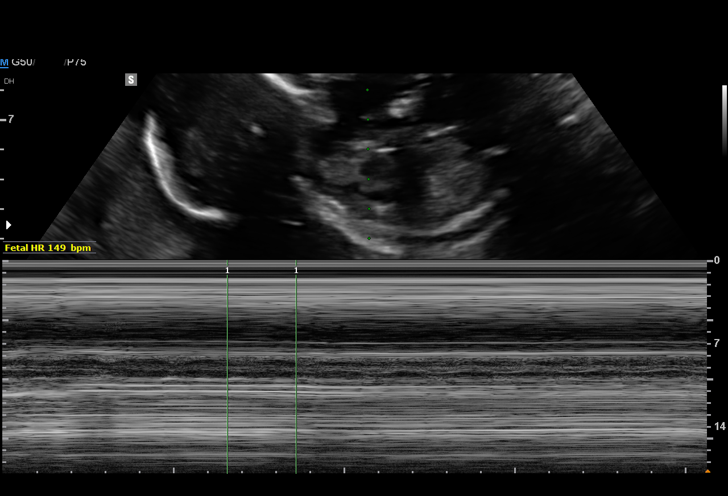
[im 10/86]
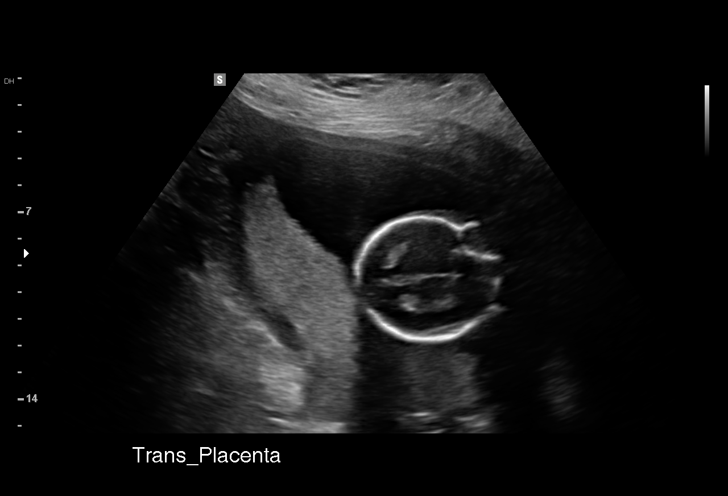
[im 16/86]
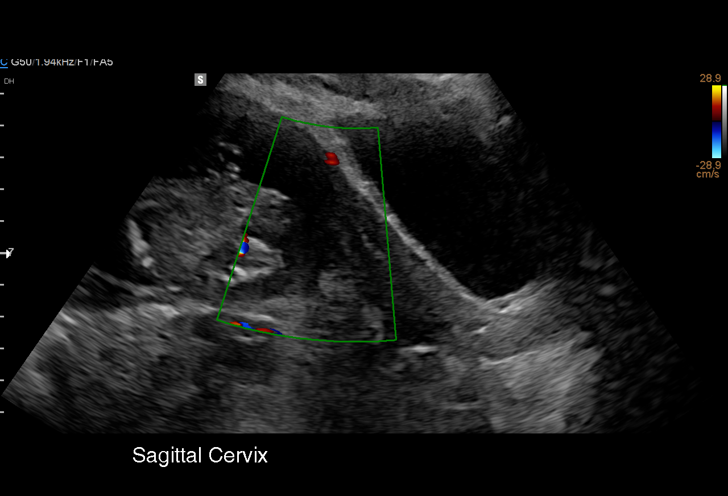
[im 23/86]
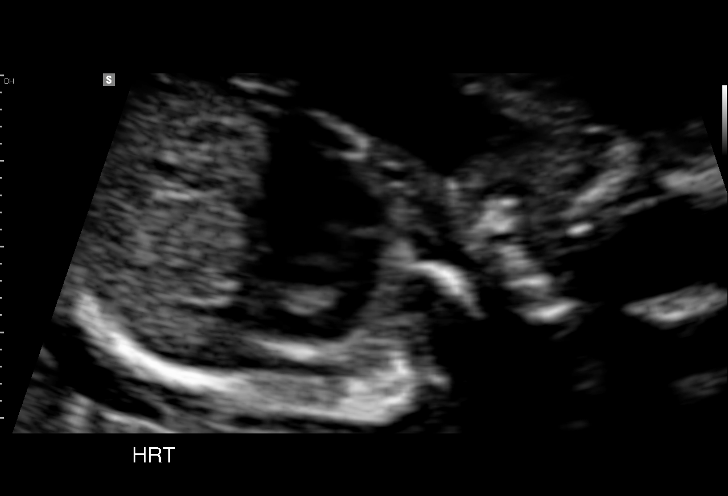
[im 29/86]
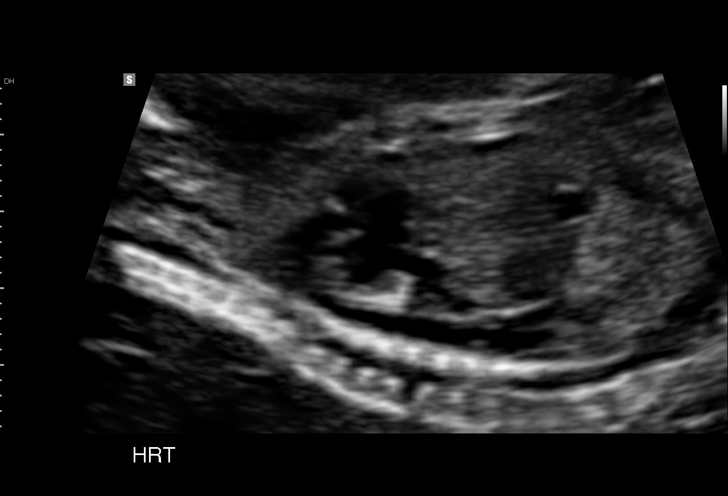
[im 35/86]
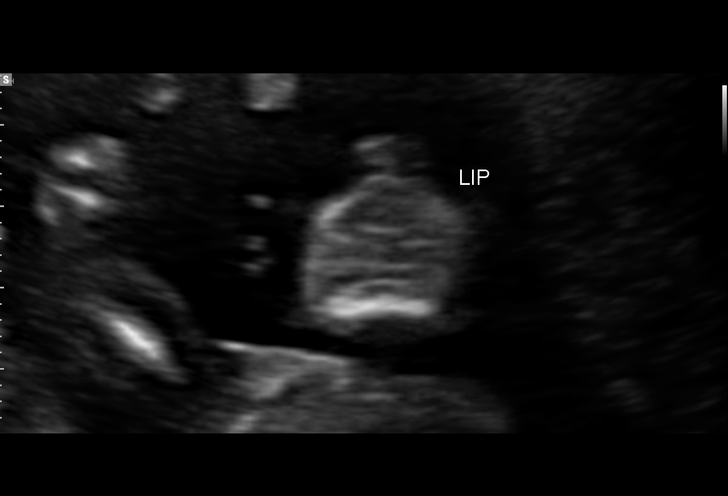
[im 41/86]
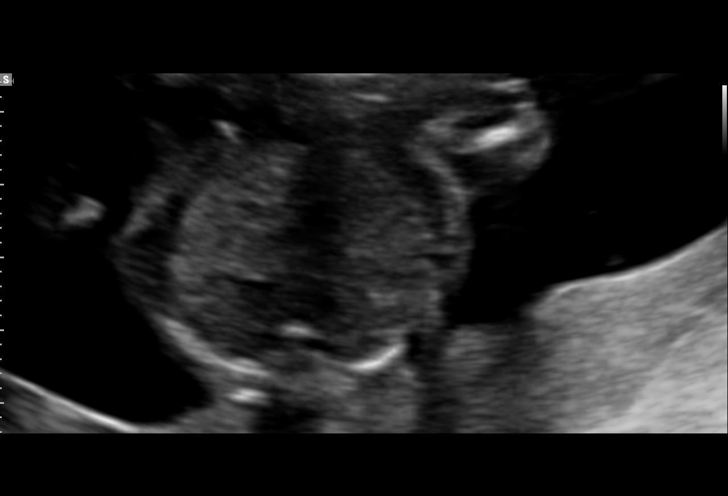
[im 48/86]
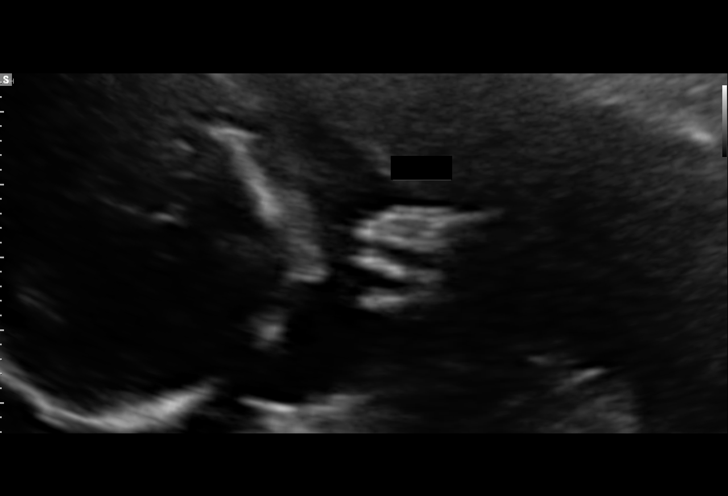
[im 54/86]
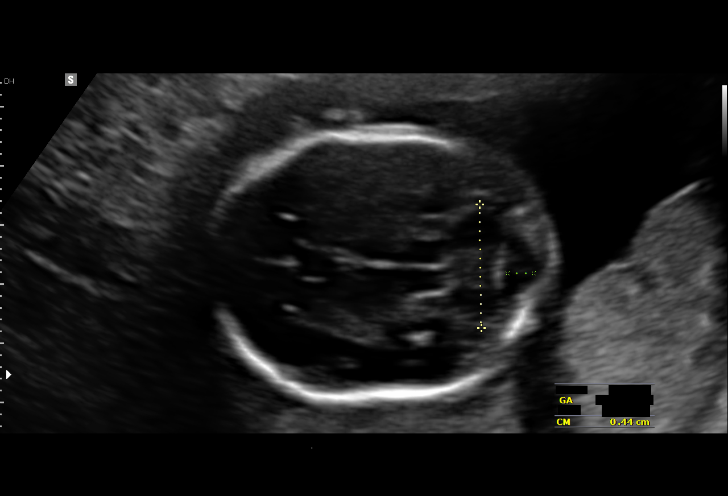
[im 60/86]
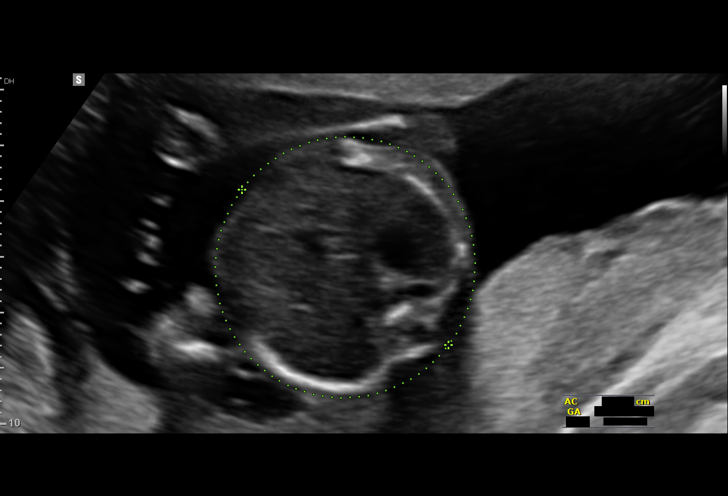
[im 67/86]
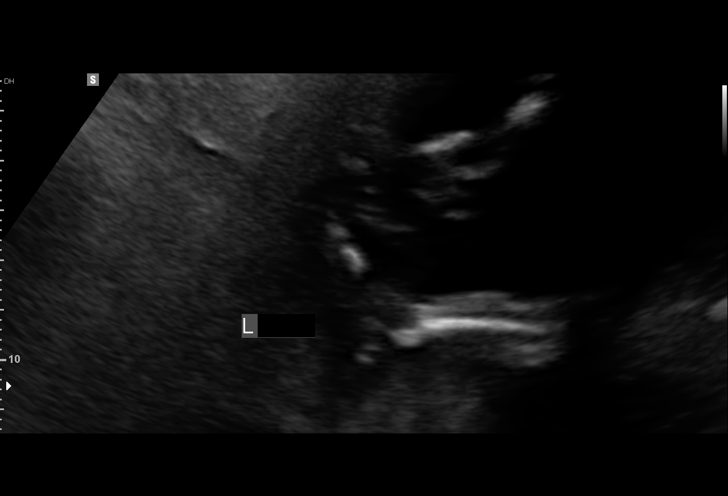
[im 73/86]
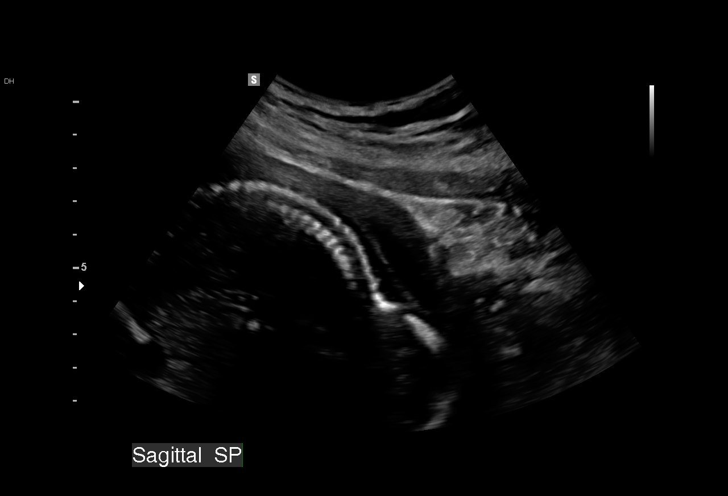
[im 79/86]
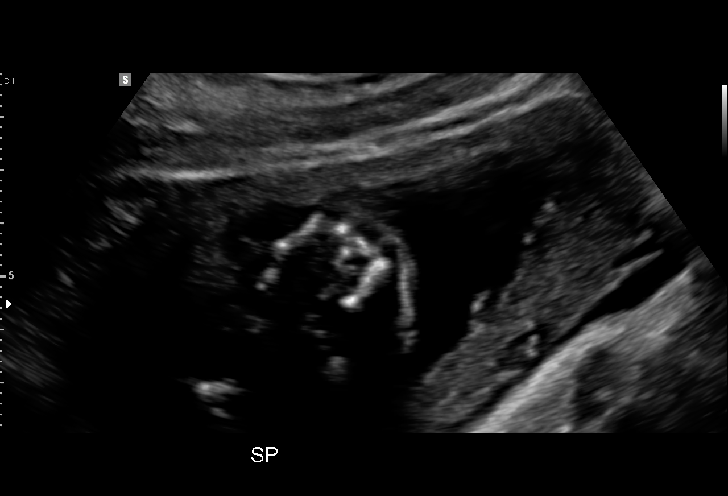
[im 86/86]
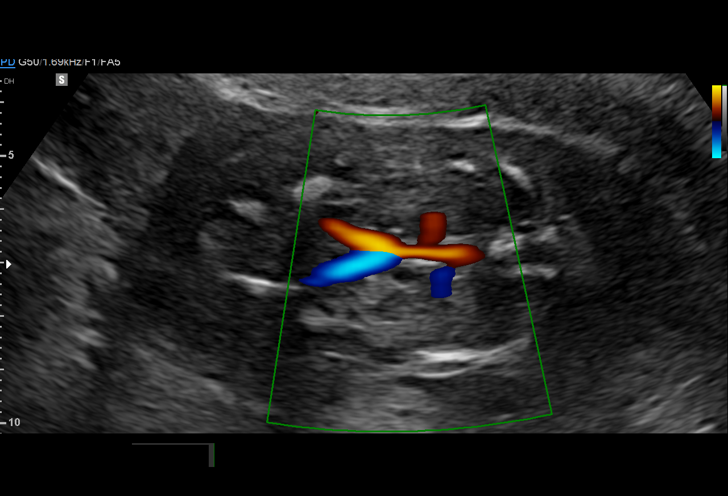

[14 of 28 positions shown; findings below may reference images not displayed]

[facility]yn Clinic
[REDACTED]-
Faculty Physician

1  ERLINDA ALIANZA            [id number]      [id number]     [id number]
Indications

19 weeks gestation of pregnancy
Detailed fetal anatomic survey                 Z36
Hypertension - Chronic/Pre-existing (on
labetalol)
Diabetes - Pregestational, 2nd trimester on
metformin and insulin
Hypothyroid (on Synthroid)
Obesity complicating pregnancy, second
trimester
OB History

Gravidity:    1
Fetal Evaluation

Num Of Fetuses:     1
Fetal Heart         149
Rate(bpm):
Cardiac Activity:   Observed
Presentation:       Variable
Placenta:           Posterior, above cervical os
P. Cord Insertion:  Visualized, central

Amniotic Fluid
AFI FV:      Subjectively within normal limits

Largest Pocket(cm)
5.6
Biometry

BPD:      43.6  mm     G. Age:  19w 1d                  CI:         67.54  %    70 - 86
FL/HC:       17.9  %    16.8 -
HC:      169.8  mm     G. Age:  19w 4d         37  %    HC/AC:       1.12       1.09 -
AC:      151.4  mm     G. Age:  20w 2d         66  %    FL/BPD:      69.7  %
FL:       30.4  mm     G. Age:  19w 3d         32  %    FL/AC:       20.1  %    20 - 24
HUM:        31  mm     G. Age:  20w 2d         68  %
CER:      20.8  mm     G. Age:  19w 6d         51  %
NFT:       3.5  mm

CM:        4.4  mm

Est. FW:     318   gm    0 lb 11 oz     51  %
Gestational Age

U/S Today:     19w 4d                                        EDD:    10/12/15
Best:          19w 5d     Det. By:  Early Ultrasound         EDD:    10/11/15
(02/18/15)
Anatomy

Cranium:               Appears normal         Aortic Arch:            Appears normal
Cavum:                 Appears normal         Ductal Arch:            Appears normal
Ventricles:            Appears normal         Diaphragm:              Appears normal
Choroid Plexus:        Appears normal         Stomach:                Appears normal, left
sided
Cerebellum:            Appears normal         Abdomen:                Appears normal
Posterior Fossa:       Appears normal         Abdominal Wall:         Appears nml (cord
insert, abd wall)
Nuchal Fold:           Appears normal         Cord Vessels:           Appears normal (3
vessel cord)
Face:                  Appears normal         Kidneys:                Appear normal
(orbits and profile)
Lips:                  Appears normal         Bladder:                Appears normal
Thoracic:              Appears normal         Spine:                  Appears normal
Heart:                 Appears normal         Upper Extremities:      Appears normal
(4CH, axis, and
situs)
RVOT:                  Appears normal         Lower Extremities:      Appears normal
LVOT:                  Appears normal

Other:  Fetus appears to be a female. Heels and 5th digit visualized. Nasal
bone visualized. Technically difficult due to maternal habitus and fetal
position.
Cervix Uterus Adnexa

Cervix
Length:            4.4  cm.
Normal appearance by transabdominal scan.

Left Ovary
Within normal limits.

Right Ovary
Within normal limits.

Adnexa:       No abnormality visualized.
Impression

SIUP at 19+5 weeks
Normal detailed fetal anatomy
Markers of aneuploidy: none
Normal amniotic fluid volume
Measurements consistent with LMP dating
Recommendations

Follow-up ultrasound for growth in 4-6 weeks
